# Patient Record
Sex: Male | Born: 1985 | Race: White | Hispanic: No | Smoking: Former smoker
Health system: Southern US, Community
[De-identification: ages and names within clinical notes are randomized; demographics above are authoritative.]

## PROBLEM LIST (undated history)

## (undated) DIAGNOSIS — F419 Anxiety disorder, unspecified: Secondary | ICD-10-CM

## (undated) DIAGNOSIS — F909 Attention-deficit hyperactivity disorder, unspecified type: Secondary | ICD-10-CM

## (undated) DIAGNOSIS — F329 Major depressive disorder, single episode, unspecified: Secondary | ICD-10-CM

## (undated) DIAGNOSIS — F32A Depression, unspecified: Secondary | ICD-10-CM

---

## 2014-02-03 ENCOUNTER — Emergency Department (HOSPITAL_COMMUNITY): Payer: Self-pay

## 2014-02-03 ENCOUNTER — Emergency Department (HOSPITAL_COMMUNITY)
Admission: EM | Admit: 2014-02-03 | Discharge: 2014-02-03 | Disposition: A | Discharge status: Home / Self Care | Payer: Self-pay | Attending: Emergency Medicine | Admitting: Emergency Medicine

## 2014-02-03 ENCOUNTER — Encounter (HOSPITAL_COMMUNITY): Payer: Self-pay | Admitting: Emergency Medicine

## 2014-02-03 DIAGNOSIS — Z720 Tobacco use: Secondary | ICD-10-CM | POA: Insufficient documentation

## 2014-02-03 DIAGNOSIS — S32010A Wedge compression fracture of first lumbar vertebra, initial encounter for closed fracture: Secondary | ICD-10-CM | POA: Insufficient documentation

## 2014-02-03 DIAGNOSIS — Y92410 Unspecified street and highway as the place of occurrence of the external cause: Secondary | ICD-10-CM | POA: Insufficient documentation

## 2014-02-03 DIAGNOSIS — Y990 Civilian activity done for income or pay: Secondary | ICD-10-CM | POA: Insufficient documentation

## 2014-02-03 DIAGNOSIS — Y9389 Activity, other specified: Secondary | ICD-10-CM | POA: Insufficient documentation

## 2014-02-03 DIAGNOSIS — S32000A Wedge compression fracture of unspecified lumbar vertebra, initial encounter for closed fracture: Secondary | ICD-10-CM

## 2014-02-03 DIAGNOSIS — R451 Restlessness and agitation: Secondary | ICD-10-CM | POA: Insufficient documentation

## 2014-02-03 LAB — CBC WITH DIFFERENTIAL/PLATELET
BASOS PCT: 1 % (ref 0–1)
Basophils Absolute: 0.1 10*3/uL (ref 0.0–0.1)
Eosinophils Absolute: 0.2 10*3/uL (ref 0.0–0.7)
Eosinophils Relative: 2 % (ref 0–5)
HCT: 50.3 % (ref 39.0–52.0)
HEMOGLOBIN: 17.7 g/dL — AB (ref 13.0–17.0)
LYMPHS PCT: 28 % (ref 12–46)
Lymphs Abs: 2.3 10*3/uL (ref 0.7–4.0)
MCH: 32.7 pg (ref 26.0–34.0)
MCHC: 35.2 g/dL (ref 30.0–36.0)
MCV: 93 fL (ref 78.0–100.0)
MONO ABS: 0.9 10*3/uL (ref 0.1–1.0)
Monocytes Relative: 11 % (ref 3–12)
NEUTROS PCT: 58 % (ref 43–77)
Neutro Abs: 4.8 10*3/uL (ref 1.7–7.7)
PLATELETS: 233 10*3/uL (ref 150–400)
RBC: 5.41 MIL/uL (ref 4.22–5.81)
RDW: 14.2 % (ref 11.5–15.5)
WBC: 8.3 10*3/uL (ref 4.0–10.5)

## 2014-02-03 LAB — URINALYSIS, ROUTINE W REFLEX MICROSCOPIC
Bilirubin Urine: NEGATIVE
GLUCOSE, UA: NEGATIVE mg/dL
Hgb urine dipstick: NEGATIVE
KETONES UR: NEGATIVE mg/dL
LEUKOCYTES UA: NEGATIVE
NITRITE: NEGATIVE
Protein, ur: NEGATIVE mg/dL
Specific Gravity, Urine: 1.006 (ref 1.005–1.030)
Urobilinogen, UA: 0.2 mg/dL (ref 0.0–1.0)
pH: 6.5 (ref 5.0–8.0)

## 2014-02-03 LAB — COMPREHENSIVE METABOLIC PANEL
ALT: 22 U/L (ref 0–53)
ANION GAP: 9 (ref 5–15)
AST: 46 U/L — ABNORMAL HIGH (ref 0–37)
Albumin: 4.2 g/dL (ref 3.5–5.2)
Alkaline Phosphatase: 71 U/L (ref 39–117)
CO2: 28 mmol/L (ref 19–32)
CREATININE: 0.94 mg/dL (ref 0.50–1.35)
Calcium: 9.4 mg/dL (ref 8.4–10.5)
Chloride: 105 mEq/L (ref 96–112)
GFR calc non Af Amer: >90 mL/min (ref >90)
Glucose, Bld: 89 mg/dL (ref 70–99)
Potassium: 3.7 mmol/L (ref 3.5–5.1)
Sodium: 142 mmol/L (ref 135–145)
TOTAL PROTEIN: 6.8 g/dL (ref 6.0–8.3)
Total Bilirubin: 0.6 mg/dL (ref 0.3–1.2)

## 2014-02-03 LAB — RAPID URINE DRUG SCREEN, HOSP PERFORMED
Amphetamines: POSITIVE — AB
Barbiturates: NOT DETECTED
Benzodiazepines: NOT DETECTED
Cocaine: NOT DETECTED
OPIATES: NOT DETECTED
Tetrahydrocannabinol: NOT DETECTED

## 2014-02-03 LAB — ETHANOL: ALCOHOL ETHYL (B): 218 mg/dL — AB (ref 0–9)

## 2014-02-03 MED ORDER — HYDROMORPHONE HCL 1 MG/ML IJ SOLN
1.0000 mg | Freq: Once | INTRAMUSCULAR | Status: AC
  Administered 2014-02-03: 1 mg via INTRAVENOUS
  Filled 2014-02-03: qty 1

## 2014-02-03 MED ORDER — KETOROLAC TROMETHAMINE 30 MG/ML IJ SOLN
30.0000 mg | Freq: Once | INTRAMUSCULAR | Status: AC
  Administered 2014-02-03: 30 mg via INTRAVENOUS
  Filled 2014-02-03: qty 1

## 2014-02-03 MED ORDER — METHOCARBAMOL 500 MG PO TABS
1000.0000 mg | ORAL_TABLET | Freq: Once | ORAL | Status: AC
  Administered 2014-02-03: 1000 mg via ORAL
  Filled 2014-02-03: qty 2

## 2014-02-03 MED ORDER — HYDROMORPHONE HCL 1 MG/ML IJ SOLN
1.0000 mg | Freq: Once | INTRAMUSCULAR | Status: AC
  Administered 2014-02-03: 1 mg via INTRAMUSCULAR
  Filled 2014-02-03: qty 1

## 2014-02-03 MED ORDER — HYDROMORPHONE HCL 1 MG/ML IJ SOLN
0.5000 mg | Freq: Once | INTRAMUSCULAR | Status: AC
  Administered 2014-02-03: 0.5 mg via INTRAVENOUS
  Filled 2014-02-03: qty 1

## 2014-02-03 MED ORDER — OXYCODONE-ACETAMINOPHEN 5-325 MG PO TABS: 2.0000 | ORAL_TABLET | ORAL | Status: DC | PRN

## 2014-02-03 MED ORDER — METHOCARBAMOL 750 MG PO TABS: 750.0000 mg | ORAL_TABLET | Freq: Three times a day (TID) | ORAL | Status: DC | PRN

## 2014-02-03 MED ORDER — IOHEXOL 300 MG/ML  SOLN
100.0000 mL | Freq: Once | INTRAMUSCULAR | Status: AC | PRN
  Administered 2014-02-03: 100 mL via INTRAVENOUS

## 2014-02-03 NOTE — ED Notes (Signed)
IV was not documented but this RN removed a 20G from the L AC.  Clean, dry and intact with cathetar present on removal.

## 2014-02-03 NOTE — ED Notes (Signed)
Patient transported to X-ray and to CT.

## 2014-02-03 NOTE — Discharge Instructions (Signed)
Wear brace 24 hours a day, 7 days a week except for shower and sleeping.  Follow up with Dr Jeral FruitBotero in 2-3 weeks.  Return to the ER for worsening pain, numbness, weakness, or other new concerning symptoms.  Expect to be sore for 2-4 weeks and develop new areas of soreness over the next 24-48 hours.   Back, Compression Fracture A compression fracture happens when a force is put upon the length of your spine. Slipping and falling on your bottom are examples of such a force. When this happens, sometimes the force is great enough to compress the building blocks (vertebral bodies) of your spine. Although this causes a lot of pain, this can usually be treated at home, unless your caregiver feels hospitalization is needed for pain control. Your backbone (spinal column) is made up of 24 main vertebral bodies in addition to the sacrum and coccyx (see illustration). These are held together by tough fibrous tissues (ligaments) and by support of your muscles. Nerve roots pass through the openings between the vertebrae. A sudden wrenching move, injury, or a fall may cause a compression fracture of one of the vertebral bodies. This may result in back pain or spread of pain into the belly (abdomen), the buttocks, and down the leg into the foot. Pain may also be created by muscle spasm alone. Large studies have been undertaken to determine the best possible course of action to help your back following injury and also to prevent future problems. The recommendations are as follows. FOLLOWING A COMPRESSION FRACTURE: Do the following only if advised by your caregiver.   If a back brace has been suggested or provided, wear it as directed.  Do not stop wearing the back brace unless instructed by your caregiver.  When allowed to return to regular activities, avoid a sedentary lifestyle. Actively exercise. Sporadic weekend binges of tennis, racquetball, or waterskiing may actually aggravate or create problems, especially if  you are not in condition for that activity.  Avoid sports requiring sudden body movements until you are in condition for them. Swimming and walking are safer activities.  Maintain good posture.  Avoid obesity.  If not already done, you should have a DEXA scan. Based on the results, be treated for osteoporosis. FOLLOWING ACUTE (SUDDEN) INJURY:  Only take over-the-counter or prescription medicines for pain, discomfort, or fever as directed by your caregiver.  Use bed rest for only the most extreme acute episode. Prolonged bed rest may aggravate your condition. Ice used for acute conditions is effective. Use a large plastic bag filled with ice. Wrap it in a towel. This also provides excellent pain relief. This may be continuous. Or use it for 30 minutes every 2 hours during acute phase, then as needed. Heat for 30 minutes prior to activities is helpful.  As soon as the acute phase (the time when your back is too painful for you to do normal activities) is over, it is important to resume normal activities and work Arboriculturisthardening programs. Back injuries can cause potentially marked changes in lifestyle. So it is important to attack these problems aggressively.  See your caregiver for continued problems. He or she can help or refer you for appropriate exercises, physical therapy, and work hardening if needed.  If you are given narcotic medications for your condition, for the next 24 hours do not:  Drive.  Operate machinery or power tools.  Sign legal documents.  Do not drink alcohol, or take sleeping pills or other medications that may interfere with  treatment. If your caregiver has given you a follow-up appointment, it is very important to keep that appointment. Not keeping the appointment could result in a chronic or permanent injury, pain, and disability. If there is any problem keeping the appointment, you must call back to this facility for assistance.  SEEK IMMEDIATE MEDICAL CARE IF:  You  develop numbness, tingling, weakness, or problems with the use of your arms or legs.  You develop severe back pain not relieved with medications.  You have changes in bowel or bladder control.  You have increasing pain in any areas of the body. Document Released: 01/28/2005 Document Revised: 06/14/2013 Document Reviewed: 09/02/2007 [facility] Patient Information 2015 [place], [place]. This information is not intended to replace advice given to you by your health care provider. Make sure you discuss any questions you have with your health care provider.  Motor Vehicle Collision It is common to have multiple bruises and sore muscles after a motor vehicle collision (MVC). These tend to feel worse for the first 24 hours. You may have the most stiffness and soreness over the first several hours. You may also feel worse when you wake up the first morning after your collision. After this point, you will usually begin to improve with each day. The speed of improvement often depends on the severity of the collision, the number of injuries, and the location and nature of these injuries. HOME CARE INSTRUCTIONS  Put ice on the injured area.  Put ice in a plastic bag.  Place a towel between your skin and the bag.  Leave the ice on for 15-20 minutes, 3-4 times a day, or as directed by your health care provider.  Drink enough fluids to keep your urine clear or pale yellow. Do not drink alcohol.  Take a warm shower or bath once or twice a day. This will increase blood flow to sore muscles.  You may return to activities as directed by your caregiver. Be careful when lifting, as this may aggravate neck or back pain.  Only take over-the-counter or prescription medicines for pain, discomfort, or fever as directed by your caregiver. Do not use aspirin. This may increase bruising and bleeding. SEEK IMMEDIATE MEDICAL CARE IF:  You have numbness, tingling, or weakness in the arms or legs.  You develop severe  headaches not relieved with medicine.  You have severe neck pain, especially tenderness in the middle of the back of your neck.  You have changes in bowel or bladder control.  There is increasing pain in any area of the body.  You have shortness of breath, light-headedness, dizziness, or fainting.  You have chest pain.  You feel sick to your stomach (nauseous), throw up (vomit), or sweat.  You have increasing abdominal discomfort.  There is blood in your urine, stool, or vomit.  You have pain in your shoulder (shoulder strap areas).  You feel your symptoms are getting worse. MAKE SURE YOU:  Understand these instructions.  Will watch your condition.  Will get help right away if you are not doing well or get worse. Document Released: 01/28/2005 Document Revised: 06/14/2013 Document Reviewed: 06/27/2010 [facility] Patient Information 2015 [place], [place]. This information is not intended to replace advice given to you by your health care provider. Make sure you discuss any questions you have with your health care provider.  Thoracolumbosacral Orthosis Brace A thoracolumbosacral orthosis (TLSO) brace can be worn for different purposes. A TLSO brace can be worn to support the back. Your back may need  more support if you had a broken bone in your back (fractured vertebrae), back surgery, or you cannot move (paralysis) your torso muscles. A TLSO brace can also be worn by a child to help prevent curving back problems from getting worse as the child grows. TLSO braces are used to limit bending and twisting. The braces are made from a hard plastic. They are custom fit for each wearer. The TLSO brace covers both the front and back of the torso. On the back, the brace reaches from just above the tailbone to just below the shoulder blades. On the front, the brace covers the ribs and often reaches past the waist and over the hips. The brace can be worn under clothing.  HOME CARE INSTRUCTIONS  Ask  your caregiver if you can remove your brace when showering or sleeping.  Follow your caregiver's instructions for putting on your brace.  Follow your caregiver's instructions about how much weight you can lift.  Always wear a T-shirt under the brace.  Make sure the brace is always well-aligned and fits you closely.  Check your skin daily for sores and redness caused by rubbing or pressure.  If you feel unsteady, use a cane or walker for support until you feel more steady.  Sit in high, firm chairs. It may be difficult to stand up from low, soft chairs.  Keep all follow-up appointments as directed by your caregiver. SEEK IMMEDIATE MEDICAL CARE IF:  You have a fever.  You have shortness of breath.  You have chest pain.  You have numbness, tingling, or pain. Developed in conjunction with the [employer] at [facility]. Document Released: 01/17/2011 Document Revised: 04/22/2011 Document Reviewed: 01/17/2011 [facility] Patient Information 2015 [place], [place]. This information is not intended to replace advice given to you by your health care provider. Make sure you discuss any questions you have with your health care provider.

## 2014-02-03 NOTE — ED Notes (Signed)
[facility] has arrived with pt brace
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
## 2014-02-03 NOTE — ED Notes (Signed)
Reviewed discharge instructions with pt and answered his mothers questions

## 2014-02-03 NOTE — ED Notes (Signed)
Patient transported to X-ray 

## 2014-02-03 NOTE — ED Notes (Addendum)
Timothy Moses [phone], call when patient is ready for discharge.

## 2014-02-03 NOTE — ED Notes (Signed)
Called ortho tech to check on progress of getting pt a brace for his back. States that the order was placed at 9 am. States is should arrive soon

## 2014-02-03 NOTE — ED Notes (Signed)
Ortho called and notified to send TLSO brace.  Patient updated on the status and that he will be waiting for several more hours until brace is available per Ortho.

## 2014-05-17 ENCOUNTER — Encounter (HOSPITAL_COMMUNITY): Payer: Self-pay | Admitting: *Deleted

## 2014-05-17 ENCOUNTER — Inpatient Hospital Stay (HOSPITAL_COMMUNITY)
Admission: AD | Admit: 2014-05-17 | Discharge: 2014-05-23 | DRG: 885 | Disposition: A | Discharge status: Home / Self Care | Payer: Federal, State, Local not specified - Other | Source: Intra-hospital | Attending: Psychiatry | Admitting: Psychiatry

## 2014-05-17 ENCOUNTER — Encounter (HOSPITAL_COMMUNITY): Payer: Self-pay | Admitting: Emergency Medicine

## 2014-05-17 ENCOUNTER — Emergency Department (HOSPITAL_COMMUNITY)
Admission: EM | Admit: 2014-05-17 | Discharge: 2014-05-17 | Disposition: A | Discharge status: Other Acute Inpatient Hospital | Payer: Self-pay | Attending: Emergency Medicine | Admitting: Emergency Medicine

## 2014-05-17 ENCOUNTER — Emergency Department (HOSPITAL_COMMUNITY): Payer: Self-pay

## 2014-05-17 DIAGNOSIS — R45851 Suicidal ideations: Secondary | ICD-10-CM | POA: Diagnosis present

## 2014-05-17 DIAGNOSIS — F1994 Other psychoactive substance use, unspecified with psychoactive substance-induced mood disorder: Secondary | ICD-10-CM | POA: Diagnosis present

## 2014-05-17 DIAGNOSIS — F332 Major depressive disorder, recurrent severe without psychotic features: Secondary | ICD-10-CM | POA: Diagnosis present

## 2014-05-17 DIAGNOSIS — F1020 Alcohol dependence, uncomplicated: Secondary | ICD-10-CM | POA: Diagnosis not present

## 2014-05-17 DIAGNOSIS — Y998 Other external cause status: Secondary | ICD-10-CM | POA: Insufficient documentation

## 2014-05-17 DIAGNOSIS — F10230 Alcohol dependence with withdrawal, uncomplicated: Secondary | ICD-10-CM | POA: Insufficient documentation

## 2014-05-17 DIAGNOSIS — F419 Anxiety disorder, unspecified: Secondary | ICD-10-CM | POA: Diagnosis not present

## 2014-05-17 DIAGNOSIS — Z811 Family history of alcohol abuse and dependence: Secondary | ICD-10-CM | POA: Diagnosis not present

## 2014-05-17 DIAGNOSIS — R4182 Altered mental status, unspecified: Secondary | ICD-10-CM

## 2014-05-17 DIAGNOSIS — T189XXA Foreign body of alimentary tract, part unspecified, initial encounter: Secondary | ICD-10-CM

## 2014-05-17 DIAGNOSIS — F1920 Other psychoactive substance dependence, uncomplicated: Secondary | ICD-10-CM | POA: Diagnosis present

## 2014-05-17 DIAGNOSIS — Y929 Unspecified place or not applicable: Secondary | ICD-10-CM | POA: Diagnosis not present

## 2014-05-17 DIAGNOSIS — F909 Attention-deficit hyperactivity disorder, unspecified type: Secondary | ICD-10-CM | POA: Diagnosis present

## 2014-05-17 DIAGNOSIS — T43212A Poisoning by selective serotonin and norepinephrine reuptake inhibitors, intentional self-harm, initial encounter: Secondary | ICD-10-CM | POA: Insufficient documentation

## 2014-05-17 DIAGNOSIS — T50901A Poisoning by unspecified drugs, medicaments and biological substances, accidental (unintentional), initial encounter: Secondary | ICD-10-CM | POA: Diagnosis present

## 2014-05-17 DIAGNOSIS — G4700 Insomnia, unspecified: Secondary | ICD-10-CM | POA: Diagnosis present

## 2014-05-17 DIAGNOSIS — F4010 Social phobia, unspecified: Secondary | ICD-10-CM | POA: Diagnosis not present

## 2014-05-17 DIAGNOSIS — Y9389 Activity, other specified: Secondary | ICD-10-CM | POA: Insufficient documentation

## 2014-05-17 DIAGNOSIS — Y9289 Other specified places as the place of occurrence of the external cause: Secondary | ICD-10-CM | POA: Insufficient documentation

## 2014-05-17 DIAGNOSIS — F17210 Nicotine dependence, cigarettes, uncomplicated: Secondary | ICD-10-CM | POA: Diagnosis present

## 2014-05-17 DIAGNOSIS — IMO0001 Reserved for inherently not codable concepts without codable children: Secondary | ICD-10-CM

## 2014-05-17 DIAGNOSIS — Z720 Tobacco use: Secondary | ICD-10-CM | POA: Insufficient documentation

## 2014-05-17 DIAGNOSIS — F902 Attention-deficit hyperactivity disorder, combined type: Secondary | ICD-10-CM | POA: Insufficient documentation

## 2014-05-17 DIAGNOSIS — Z79899 Other long term (current) drug therapy: Secondary | ICD-10-CM | POA: Insufficient documentation

## 2014-05-17 DIAGNOSIS — IMO0002 Reserved for concepts with insufficient information to code with codable children: Secondary | ICD-10-CM | POA: Insufficient documentation

## 2014-05-17 DIAGNOSIS — T1491 Suicide attempt: Secondary | ICD-10-CM

## 2014-05-17 DIAGNOSIS — F1990 Other psychoactive substance use, unspecified, uncomplicated: Secondary | ICD-10-CM

## 2014-05-17 HISTORY — DX: Major depressive disorder, single episode, unspecified: F32.9

## 2014-05-17 HISTORY — DX: Attention-deficit hyperactivity disorder, unspecified type: F90.9

## 2014-05-17 HISTORY — DX: Anxiety disorder, unspecified: F41.9

## 2014-05-17 HISTORY — DX: Depression, unspecified: F32.A

## 2014-05-17 LAB — RAPID URINE DRUG SCREEN, HOSP PERFORMED
AMPHETAMINES: POSITIVE — AB
Barbiturates: NOT DETECTED
Benzodiazepines: POSITIVE — AB
Cocaine: POSITIVE — AB
OPIATES: NOT DETECTED
Tetrahydrocannabinol: POSITIVE — AB

## 2014-05-17 LAB — COMPREHENSIVE METABOLIC PANEL
ALK PHOS: 62 U/L (ref 39–117)
ALT: 18 U/L (ref 0–53)
AST: 30 U/L (ref 0–37)
Albumin: 4.3 g/dL (ref 3.5–5.2)
Anion gap: 12 (ref 5–15)
BUN: 11 mg/dL (ref 6–23)
CALCIUM: 8.8 mg/dL (ref 8.4–10.5)
CO2: 24 mmol/L (ref 19–32)
Chloride: 106 mmol/L (ref 96–112)
Creatinine, Ser: 0.8 mg/dL (ref 0.50–1.35)
GFR calc Af Amer: >90 mL/min (ref >90)
GFR calc non Af Amer: >90 mL/min (ref >90)
Glucose, Bld: 110 mg/dL — ABNORMAL HIGH (ref 70–99)
POTASSIUM: 3.4 mmol/L — AB (ref 3.5–5.1)
SODIUM: 142 mmol/L (ref 135–145)
TOTAL PROTEIN: 7.1 g/dL (ref 6.0–8.3)
Total Bilirubin: 0.5 mg/dL (ref 0.3–1.2)

## 2014-05-17 LAB — CBC WITH DIFFERENTIAL/PLATELET
BASOS ABS: 0 10*3/uL (ref 0.0–0.1)
Basophils Relative: 1 % (ref 0–1)
EOS PCT: 3 % (ref 0–5)
Eosinophils Absolute: 0.2 10*3/uL (ref 0.0–0.7)
HCT: 42.7 % (ref 39.0–52.0)
Hemoglobin: 14.8 g/dL (ref 13.0–17.0)
LYMPHS ABS: 2.3 10*3/uL (ref 0.7–4.0)
LYMPHS PCT: 36 % (ref 12–46)
MCH: 32.2 pg (ref 26.0–34.0)
MCHC: 34.7 g/dL (ref 30.0–36.0)
MCV: 92.8 fL (ref 78.0–100.0)
Monocytes Absolute: 0.6 10*3/uL (ref 0.1–1.0)
Monocytes Relative: 10 % (ref 3–12)
NEUTROS PCT: 50 % (ref 43–77)
Neutro Abs: 3.1 10*3/uL (ref 1.7–7.7)
PLATELETS: 255 10*3/uL (ref 150–400)
RBC: 4.6 MIL/uL (ref 4.22–5.81)
RDW: 12.9 % (ref 11.5–15.5)
WBC: 6.3 10*3/uL (ref 4.0–10.5)

## 2014-05-17 LAB — BLOOD GAS, ARTERIAL
Acid-base deficit: 3 mmol/L — ABNORMAL HIGH (ref 0.0–2.0)
BICARBONATE: 22.4 meq/L (ref 20.0–24.0)
Drawn by: 31814
FIO2: 0.21 %
O2 SAT: 93.5 %
Patient temperature: 97.6
TCO2: 20 mmol/L (ref 0–100)
pCO2 arterial: 42.5 mmHg (ref 35.0–45.0)
pH, Arterial: 7.339 — ABNORMAL LOW (ref 7.350–7.450)
pO2, Arterial: 76.1 mmHg — ABNORMAL LOW (ref 80.0–100.0)

## 2014-05-17 LAB — URINALYSIS, ROUTINE W REFLEX MICROSCOPIC
Bilirubin Urine: NEGATIVE
GLUCOSE, UA: NEGATIVE mg/dL
Hgb urine dipstick: NEGATIVE
Ketones, ur: NEGATIVE mg/dL
LEUKOCYTES UA: NEGATIVE
NITRITE: NEGATIVE
PROTEIN: NEGATIVE mg/dL
SPECIFIC GRAVITY, URINE: 1.005 (ref 1.005–1.030)
Urobilinogen, UA: 0.2 mg/dL (ref 0.0–1.0)
pH: 6 (ref 5.0–8.0)

## 2014-05-17 LAB — ACETAMINOPHEN LEVEL

## 2014-05-17 LAB — ETHANOL: Alcohol, Ethyl (B): 222 mg/dL — ABNORMAL HIGH (ref 0–9)

## 2014-05-17 LAB — CBG MONITORING, ED: Glucose-Capillary: 86 mg/dL (ref 70–99)

## 2014-05-17 LAB — SALICYLATE LEVEL: Salicylate Lvl: <4 mg/dL (ref 2.8–20.0)

## 2014-05-17 MED ORDER — ACETAMINOPHEN 325 MG PO TABS
650.0000 mg | ORAL_TABLET | Freq: Four times a day (QID) | ORAL | Status: DC | PRN
Start: 2014-05-17 — End: 2014-05-23

## 2014-05-17 MED ORDER — ADULT MULTIVITAMIN W/MINERALS CH
1.0000 | ORAL_TABLET | Freq: Every day | ORAL | Status: DC
  Administered 2014-05-18 – 2014-05-23 (×6): 1 via ORAL
  Filled 2014-05-17 (×8): qty 1

## 2014-05-17 MED ORDER — ALUM & MAG HYDROXIDE-SIMETH 200-200-20 MG/5ML PO SUSP: 30.0000 mL | ORAL | Status: DC | PRN

## 2014-05-17 MED ORDER — ONDANSETRON 4 MG PO TBDP: 4.0000 mg | ORAL_TABLET | Freq: Four times a day (QID) | ORAL | Status: AC | PRN

## 2014-05-17 MED ORDER — THIAMINE HCL 100 MG/ML IJ SOLN: 100.0000 mg | Freq: Once | INTRAMUSCULAR | Status: DC

## 2014-05-17 MED ORDER — VITAMIN B-1 100 MG PO TABS
100.0000 mg | ORAL_TABLET | Freq: Every day | ORAL | Status: DC
  Administered 2014-05-18 – 2014-05-23 (×6): 100 mg via ORAL
  Filled 2014-05-17 (×8): qty 1

## 2014-05-17 MED ORDER — LORAZEPAM 1 MG PO TABS
1.0000 mg | ORAL_TABLET | Freq: Every day | ORAL | Status: AC
  Administered 2014-05-22: 1 mg via ORAL
  Filled 2014-05-17: qty 1

## 2014-05-17 MED ORDER — LORAZEPAM 1 MG PO TABS
1.0000 mg | ORAL_TABLET | Freq: Four times a day (QID) | ORAL | Status: AC
  Administered 2014-05-17 – 2014-05-19 (×6): 1 mg via ORAL
  Filled 2014-05-17 (×6): qty 1

## 2014-05-17 MED ORDER — LORAZEPAM 1 MG PO TABS
1.0000 mg | ORAL_TABLET | Freq: Three times a day (TID) | ORAL | Status: AC
  Administered 2014-05-19 – 2014-05-20 (×3): 1 mg via ORAL
  Filled 2014-05-17 (×3): qty 1

## 2014-05-17 MED ORDER — MAGNESIUM HYDROXIDE 400 MG/5ML PO SUSP: 30.0000 mL | Freq: Every day | ORAL | Status: DC | PRN

## 2014-05-17 MED ORDER — POTASSIUM CHLORIDE CRYS ER 20 MEQ PO TBCR
20.0000 meq | EXTENDED_RELEASE_TABLET | Freq: Two times a day (BID) | ORAL | Status: AC
  Administered 2014-05-17 – 2014-05-19 (×4): 20 meq via ORAL
  Filled 2014-05-17 (×6): qty 1

## 2014-05-17 MED ORDER — LORAZEPAM 1 MG PO TABS
1.0000 mg | ORAL_TABLET | Freq: Two times a day (BID) | ORAL | Status: AC
  Administered 2014-05-20 – 2014-05-21 (×2): 1 mg via ORAL
  Filled 2014-05-17 (×2): qty 1

## 2014-05-17 MED ORDER — SODIUM CHLORIDE 0.9 % IV SOLN
Freq: Once | INTRAVENOUS | Status: AC
  Administered 2014-05-17: 03:00:00 via INTRAVENOUS

## 2014-05-17 MED ORDER — DOXEPIN HCL 50 MG PO CAPS
50.0000 mg | ORAL_CAPSULE | Freq: Every evening | ORAL | Status: DC | PRN
  Administered 2014-05-17 – 2014-05-22 (×11): 50 mg via ORAL
  Filled 2014-05-17: qty 1
  Filled 2014-05-17: qty 2
  Filled 2014-05-17 (×15): qty 1

## 2014-05-17 MED ORDER — LORAZEPAM 1 MG PO TABS
1.0000 mg | ORAL_TABLET | Freq: Four times a day (QID) | ORAL | Status: AC | PRN
  Administered 2014-05-20: 1 mg via ORAL
  Filled 2014-05-17: qty 1

## 2014-05-17 MED ORDER — HYDROXYZINE HCL 25 MG PO TABS
25.0000 mg | ORAL_TABLET | Freq: Four times a day (QID) | ORAL | Status: DC | PRN
  Administered 2014-05-18 – 2014-05-19 (×2): 25 mg via ORAL
  Filled 2014-05-17 (×2): qty 1

## 2014-05-17 MED ORDER — NALOXONE HCL 1 MG/ML IJ SOLN
1.0000 mg | Freq: Once | INTRAMUSCULAR | Status: AC
  Administered 2014-05-17: 1 mg via INTRAVENOUS
  Filled 2014-05-17: qty 2

## 2014-05-17 MED ORDER — LOPERAMIDE HCL 2 MG PO CAPS: 2.0000 mg | ORAL_CAPSULE | ORAL | Status: AC | PRN

## 2014-05-17 NOTE — ED Notes (Signed)
Patient continues to be lethargic but oriented. Denies SI.  Food/fluids given. Acuity low.

## 2014-05-17 NOTE — ED Notes (Signed)
Acuity low. 

## 2014-05-17 NOTE — ED Notes (Signed)
Pt resting quietly with eyes closed. Appears in no distress. Respirations are even, regular, and unlabored.  

## 2014-05-17 NOTE — ED Provider Notes (Signed)
CSN: [id number]     Arrival date & time 05/17/14  0219 History   First MD Initiated Contact with Patient 05/17/14 0224     Chief Complaint  Patient presents with  . Ingestion   Level V caveat for altered mental status.  (Consider location/radiation/quality/duration/timing/severity/associated sxs/prior Treatment) HPI  Patient reports about 10 PM tonight he took 8 pills. He states he took trazodone. When I ask him why he states "I was bored".  PCP unknown  Past Medical History  Diagnosis Date  . ADHD (attention deficit hyperactivity disorder)   . Depression   . Anxiety    History reviewed. No pertinent past surgical history. No family history on file. History  Substance Use Topics  . Smoking status: Current Every Day Smoker  . Smokeless tobacco: Not on file  . Alcohol Use: Yes     Comment: weekends    Review of Systems  Unable to perform ROS: Mental status change      Allergies  Review of patient's allergies indicates no known allergies.  Home Medications   Prior to Admission medications   Medication Sig Start Date End Date Taking? Authorizing Provider  amphetamine-dextroamphetamine (ADDERALL) 20 MG tablet Take 20 mg by mouth daily.   Yes Historical Provider, MD  traZODone (DESYREL) 150 MG tablet Take 150 mg by mouth at bedtime as needed for sleep.   Yes Historical Provider, MD     ED Triage Vitals  Enc Vitals Group     BP 05/17/14 0223 103/57 mmHg     Pulse Rate 05/17/14 0223 107     Resp 05/17/14 0223 16     Temp 05/17/14 0223 97.6 F (36.4 C)     Temp Source 05/17/14 0223 Oral     SpO2 05/17/14 0223 97 %     Weight --      Height --      Head Cir --      Peak Flow --      Pain Score --      Pain Loc --      Pain Edu? --      Excl. in GC? --    Vital signs normal except for tachycardia   Physical Exam  Constitutional: He is oriented to person, place, and time. He appears well-developed and well-nourished.  Non-toxic appearance. He does not appear  ill. No distress.  Sleeping, easily awakened however he falls back asleep rapidly.  HENT:  Head: Normocephalic and atraumatic.  Right Ear: External ear normal.  Left Ear: External ear normal.  Nose: Nose normal. No mucosal edema or rhinorrhea.  Mouth/Throat: Oropharynx is clear and moist and mucous membranes are normal. No dental abscesses or uvula swelling.  Eyes: Conjunctivae and EOM are normal. Pupils are equal, round, and reactive to light.  Neck: Normal range of motion and full passive range of motion without pain. Neck supple.  Cardiovascular: Normal rate, regular rhythm and normal heart sounds.  Exam reveals no gallop and no friction rub.   No murmur heard. Pulmonary/Chest: Effort normal and breath sounds normal. No respiratory distress. He has no wheezes. He has no rhonchi. He has no rales. He exhibits no tenderness and no crepitus.  Abdominal: Soft. Normal appearance and bowel sounds are normal. He exhibits no distension. There is no tenderness. There is no rebound and no guarding.  Musculoskeletal: Normal range of motion. He exhibits no edema or tenderness.  Moves all extremities well.   Neurological: He is alert and oriented to person, place, and  time. He has normal strength. No cranial nerve deficit.  Skin: Skin is warm, dry and intact. No rash noted. No erythema. No pallor.  Psychiatric: His speech is delayed and slurred. He is slowed.  Flat affect  Nursing note and vitals reviewed.   ED Course  Procedures (including critical care time)  Medications  naloxone (NARCAN) injection 1 mg (1 mg Intravenous Given 05/17/14 0238)  0.9 %  sodium chloride infusion ( Intravenous Stopped 05/17/14 0412)   Patient was discussed with poison control by nursing staff. They state to watch for somnolence and QT changes. They state to observe this 6 hours and if he still somnolent he may taken something else.  2:30 am patient was awake and alert. He was interacting with the patient in the next  stretcher. He called his stepmother.  5 AM patient stepmother is at his bedside. She states he called her and told her that he had tried to overdose tonight intentionally to hurt himself. TSS consult was ordered.   TSS was unable to evaluate patient b/o sleepiness.  They will try again later.   Labs Review Results for orders placed or performed during the hospital encounter of 05/17/14  Comprehensive metabolic panel  Result Value Ref Range   Sodium 142 135 - 145 mmol/L   Potassium 3.4 (L) 3.5 - 5.1 mmol/L   Chloride 106 96 - 112 mmol/L   CO2 24 19 - 32 mmol/L   Glucose, Bld 110 (H) 70 - 99 mg/dL   BUN 11 6 - 23 mg/dL   Creatinine, Ser 1.61 0.50 - 1.35 mg/dL   Calcium 8.8 8.4 - 09.6 mg/dL   Total Protein 7.1 6.0 - 8.3 g/dL   Albumin 4.3 3.5 - 5.2 g/dL   AST 30 0 - 37 U/L   ALT 18 0 - 53 U/L   Alkaline Phosphatase 62 39 - 117 U/L   Total Bilirubin 0.5 0.3 - 1.2 mg/dL   GFR calc non Af Amer >90 >90 mL/min   GFR calc Af Amer >90 >90 mL/min   Anion gap 12 5 - 15  CBC with Differential  Result Value Ref Range   WBC 6.3 4.0 - 10.5 K/uL   RBC 4.60 4.22 - 5.81 MIL/uL   Hemoglobin 14.8 13.0 - 17.0 g/dL   HCT 04.5 40.9 - 81.1 %   MCV 92.8 78.0 - 100.0 fL   MCH 32.2 26.0 - 34.0 pg   MCHC 34.7 30.0 - 36.0 g/dL   RDW 91.4 78.2 - 95.6 %   Platelets 255 150 - 400 K/uL   Neutrophils Relative % 50 43 - 77 %   Neutro Abs 3.1 1.7 - 7.7 K/uL   Lymphocytes Relative 36 12 - 46 %   Lymphs Abs 2.3 0.7 - 4.0 K/uL   Monocytes Relative 10 3 - 12 %   Monocytes Absolute 0.6 0.1 - 1.0 K/uL   Eosinophils Relative 3 0 - 5 %   Eosinophils Absolute 0.2 0.0 - 0.7 K/uL   Basophils Relative 1 0 - 1 %   Basophils Absolute 0.0 0.0 - 0.1 K/uL  Urine rapid drug screen (hosp performed)  Result Value Ref Range   Opiates NONE DETECTED NONE DETECTED   Cocaine POSITIVE (A) NONE DETECTED   Benzodiazepines POSITIVE (A) NONE DETECTED   Amphetamines POSITIVE (A) NONE DETECTED   Tetrahydrocannabinol POSITIVE (A)  NONE DETECTED   Barbiturates NONE DETECTED NONE DETECTED  Urinalysis, Routine w reflex microscopic  Result Value Ref Range   Color,  Urine YELLOW YELLOW   APPearance CLEAR CLEAR   Specific Gravity, Urine 1.005 1.005 - 1.030   pH 6.0 5.0 - 8.0   Glucose, UA NEGATIVE NEGATIVE mg/dL   Hgb urine dipstick NEGATIVE NEGATIVE   Bilirubin Urine NEGATIVE NEGATIVE   Ketones, ur NEGATIVE NEGATIVE mg/dL   Protein, ur NEGATIVE NEGATIVE mg/dL   Urobilinogen, UA 0.2 0.0 - 1.0 mg/dL   Nitrite NEGATIVE NEGATIVE   Leukocytes, UA NEGATIVE NEGATIVE  Ethanol  Result Value Ref Range   Alcohol, Ethyl (B) 222 (H) 0 - 9 mg/dL  Salicylate level  Result Value Ref Range   Salicylate Lvl <4.0 2.8 - 20.0 mg/dL  Acetaminophen level  Result Value Ref Range   Acetaminophen (Tylenol), Serum <10.0 (L) 10 - 30 ug/mL  Blood gas, arterial ([facility] & [facility] ONLY)  Result Value Ref Range   FIO2 0.21 %   pH, Arterial 7.339 (L) 7.350 - 7.450   pCO2 arterial 42.5 35.0 - 45.0 mmHg   pO2, Arterial 76.1 (L) 80.0 - 100.0 mmHg   Bicarbonate 22.4 20.0 - 24.0 mEq/L   TCO2 20.0 0 - 100 mmol/L   Acid-base deficit 3.0 (H) 0.0 - 2.0 mmol/L   O2 Saturation 93.5 %   Patient temperature 97.6    Collection site RIGHT RADIAL    Drawn by [phone]    Sample type ARTERIAL    Allens test (pass/fail) PASS PASS  POC CBG, ED  Result Value Ref Range   Glucose-Capillary 86 70 - 99 mg/dL   Comment 1 Notify RN    Laboratory interpretation all normal except positive UDS, alcohol intoxication, mild hypokalemia        Imaging Review Dg Chest Port 1 View  05/17/2014   CLINICAL DATA:  Overdose.  Ingestion of foreign substance.  EXAM: PORTABLE CHEST - 1 VIEW  COMPARISON:  None.  FINDINGS: The heart size and mediastinal contours are within normal limits. Both lungs are clear. The visualized skeletal structures are unremarkable.  IMPRESSION: No active disease.   Electronically Signed   By: Burman NievesWilliam  Stevens M.D.   On: 05/17/2014 03:12     EKG  Interpretation   Date/Time:  Tuesday May 17 2014 02:22:08 EDT Ventricular Rate:  93 PR Interval:  135 QRS Duration: 100 QT Interval:  355 QTC Calculation: 441 R Axis:   54 Text Interpretation:  Sinus rhythm RSR' in V1 or V2, right VCD or RVH No  old tracing to compare Confirmed by Lenell Mcconnell  MD-I, Griffith Santilli ([id number]) on 05/17/2014  2:25:37 AM      MDM   Final diagnoses:  Altered mental status, unspecified altered mental status type  Drug ingestion, intentional self-harm, initial encounter    Disposition pending   Devoria AlbeIva Elmon Shader, MD, Concha PyoFACEP     Izora Benn, MD 05/17/14 [phone]

## 2014-05-17 NOTE — BH Assessment (Signed)
[facility] Assessment Progress Note   Clinician attempted to see patient at 06:00.  Pt too lethargic and drowsy to participate in TTS assessment at this time.  Clinician talked to nurse Terri about getting the consult removed and placed later when pt is more alert.  She said she would talk to Dr. Devoria AlbeIva Knapp.  Patient had reportedly taken about 6 trazadone around 22:00 on 04/04 because he was sad.  Patient was told that psychiatry would probably see him when he is more alert.  Pt appeared to be sleeping however.

## 2014-05-17 NOTE — Consult Note (Signed)
[facility] Face-to-Face Psychiatry Consult   Reason for Consult:  Overdose Referring Physician:  EDP Patient Identification: Timothy Moses MRN:  [id number] Principal Diagnosis: Depression, major, severe recurrence Diagnosis:   Patient Active Problem List   Diagnosis Date Noted  . Polysubstance dependence [F19.20] 05/17/2014    Priority: High  . Overdose [T50.901A] 05/17/2014    Priority: High  . Depression, major, severe recurrence [F33.2] 05/17/2014    Priority: High    Total Time spent with patient: 45 minutes  Subjective:   Timothy Moses is a 29 y.o. male patient admitted with intentional overdose, suicide attempt.  HPI:  The patient presented to the ED after an overdose on Trazodone after drinking, BAL 222, and using drugs, Adderall-cocaine, Benzodiazepines, THC.  He remains drowsy and denies suicide attempt but his mother is at the bedside, states he has threatened and "faked" suicide in the past.  She claims he faked suicide in the past to upset her.  He was living with his mother last summer, attending AA, and doing well.  He moved out with friends and relapsed.  He was in [place] for addictions but left in January.  Denies homicidal ideations and hallucinations. HPI Elements:   Location:  generalized. Quality:  acute. Severity:  severe. Timing:  constant. Duration:  few days. Context:  stressors, drug abuse.  Past Medical History:  Past Medical History  Diagnosis Date  . ADHD (attention deficit hyperactivity disorder)   . Depression   . Anxiety    History reviewed. No pertinent past surgical history. Family History: No family history on file. Social History:  History  Alcohol Use  . Yes    Comment: weekends     History  Drug Use No    Comment: denies    History   Social History  . Marital Status: Significant Other    Spouse Name: N/A  . Number of Children: N/A  . Years of Education: N/A   Social History Main Topics  . Smoking status: Current Every Day  Smoker  . Smokeless tobacco: Not on file  . Alcohol Use: Yes     Comment: weekends  . Drug Use: No     Comment: denies  . Sexual Activity: Not on file   Other Topics Concern  . None   Social History Narrative  . None   Additional Social History:                          Allergies:  No Known Allergies  Labs:  Results for orders placed or performed during the hospital encounter of 05/17/14 (from the past 48 hour(s))  Comprehensive metabolic panel     Status: Abnormal   Collection Time: 05/17/14  2:26 AM  Result Value Ref Range   Sodium 142 135 - 145 mmol/L   Potassium 3.4 (L) 3.5 - 5.1 mmol/L   Chloride 106 96 - 112 mmol/L   CO2 24 19 - 32 mmol/L   Glucose, Bld 110 (H) 70 - 99 mg/dL   BUN 11 6 - 23 mg/dL   Creatinine, Ser 0.80 0.50 - 1.35 mg/dL   Calcium 8.8 8.4 - 10.5 mg/dL   Total Protein 7.1 6.0 - 8.3 g/dL   Albumin 4.3 3.5 - 5.2 g/dL   AST 30 0 - 37 U/L   ALT 18 0 - 53 U/L   Alkaline Phosphatase 62 39 - 117 U/L   Total Bilirubin 0.5 0.3 - 1.2 mg/dL   GFR  calc non Af Amer >90 >90 mL/min   GFR calc Af Amer >90 >90 mL/min    Comment: (NOTE) The eGFR has been calculated using the CKD EPI equation. This calculation has not been validated in all clinical situations. eGFR's persistently <90 mL/min signify possible Chronic Kidney Disease.    Anion gap 12 5 - 15  CBC with Differential     Status: None   Collection Time: 05/17/14  2:26 AM  Result Value Ref Range   WBC 6.3 4.0 - 10.5 K/uL   RBC 4.60 4.22 - 5.81 MIL/uL   Hemoglobin 14.8 13.0 - 17.0 g/dL   HCT 42.7 39.0 - 52.0 %   MCV 92.8 78.0 - 100.0 fL   MCH 32.2 26.0 - 34.0 pg   MCHC 34.7 30.0 - 36.0 g/dL   RDW 12.9 11.5 - 15.5 %   Platelets 255 150 - 400 K/uL   Neutrophils Relative % 50 43 - 77 %   Neutro Abs 3.1 1.7 - 7.7 K/uL   Lymphocytes Relative 36 12 - 46 %   Lymphs Abs 2.3 0.7 - 4.0 K/uL   Monocytes Relative 10 3 - 12 %   Monocytes Absolute 0.6 0.1 - 1.0 K/uL   Eosinophils Relative 3 0 - 5 %    Eosinophils Absolute 0.2 0.0 - 0.7 K/uL   Basophils Relative 1 0 - 1 %   Basophils Absolute 0.0 0.0 - 0.1 K/uL  Ethanol     Status: Abnormal   Collection Time: 05/17/14  2:26 AM  Result Value Ref Range   Alcohol, Ethyl (B) 222 (H) 0 - 9 mg/dL    Comment:        LOWEST DETECTABLE LIMIT FOR SERUM ALCOHOL IS 11 mg/dL FOR MEDICAL PURPOSES ONLY   Salicylate level     Status: None   Collection Time: 05/17/14  2:26 AM  Result Value Ref Range   Salicylate Lvl <9.1 2.8 - 20.0 mg/dL  Acetaminophen level     Status: Abnormal   Collection Time: 05/17/14  2:26 AM  Result Value Ref Range   Acetaminophen (Tylenol), Serum <10.0 (L) 10 - 30 ug/mL    Comment:        THERAPEUTIC CONCENTRATIONS VARY SIGNIFICANTLY. A RANGE OF 10-30 ug/mL MAY BE AN EFFECTIVE CONCENTRATION FOR MANY PATIENTS. HOWEVER, SOME ARE BEST TREATED AT CONCENTRATIONS OUTSIDE THIS RANGE. ACETAMINOPHEN CONCENTRATIONS >150 ug/mL AT 4 HOURS AFTER INGESTION AND >50 ug/mL AT 12 HOURS AFTER INGESTION ARE OFTEN ASSOCIATED WITH TOXIC REACTIONS.   POC CBG, ED     Status: None   Collection Time: 05/17/14  2:42 AM  Result Value Ref Range   Glucose-Capillary 86 70 - 99 mg/dL   Comment 1 Notify RN   Blood gas, arterial ([facility] & [facility] ONLY)     Status: Abnormal   Collection Time: 05/17/14  2:48 AM  Result Value Ref Range   FIO2 0.21 %   pH, Arterial 7.339 (L) 7.350 - 7.450   pCO2 arterial 42.5 35.0 - 45.0 mmHg   pO2, Arterial 76.1 (L) 80.0 - 100.0 mmHg   Bicarbonate 22.4 20.0 - 24.0 mEq/L   TCO2 20.0 0 - 100 mmol/L   Acid-base deficit 3.0 (H) 0.0 - 2.0 mmol/L   O2 Saturation 93.5 %   Patient temperature 97.6    Collection site RIGHT RADIAL    Drawn by [phone]    Sample type ARTERIAL    Allens test (pass/fail) PASS PASS  Urine rapid drug screen (hosp performed)  Status: Abnormal   Collection Time: 05/17/14  3:44 AM  Result Value Ref Range   Opiates NONE DETECTED NONE DETECTED   Cocaine POSITIVE (A) NONE DETECTED    Benzodiazepines POSITIVE (A) NONE DETECTED   Amphetamines POSITIVE (A) NONE DETECTED   Tetrahydrocannabinol POSITIVE (A) NONE DETECTED   Barbiturates NONE DETECTED NONE DETECTED    Comment:        DRUG SCREEN FOR MEDICAL PURPOSES ONLY.  IF CONFIRMATION IS NEEDED FOR ANY PURPOSE, NOTIFY LAB WITHIN 5 DAYS.        LOWEST DETECTABLE LIMITS FOR URINE DRUG SCREEN Drug Class       Cutoff (ng/mL) Amphetamine      1000 Barbiturate      200 Benzodiazepine   195 Tricyclics       093 Opiates          300 Cocaine          300 THC              50   Urinalysis, Routine w reflex microscopic     Status: None   Collection Time: 05/17/14  3:44 AM  Result Value Ref Range   Color, Urine YELLOW YELLOW   APPearance CLEAR CLEAR   Specific Gravity, Urine 1.005 1.005 - 1.030   pH 6.0 5.0 - 8.0   Glucose, UA NEGATIVE NEGATIVE mg/dL   Hgb urine dipstick NEGATIVE NEGATIVE   Bilirubin Urine NEGATIVE NEGATIVE   Ketones, ur NEGATIVE NEGATIVE mg/dL   Protein, ur NEGATIVE NEGATIVE mg/dL   Urobilinogen, UA 0.2 0.0 - 1.0 mg/dL   Nitrite NEGATIVE NEGATIVE   Leukocytes, UA NEGATIVE NEGATIVE    Comment: MICROSCOPIC NOT DONE ON URINES WITH NEGATIVE PROTEIN, BLOOD, LEUKOCYTES, NITRITE, OR GLUCOSE <1000 mg/dL.    Vitals: Blood pressure 122/60, pulse 91, temperature 97.8 F (36.6 C), temperature source Oral, resp. rate 15, SpO2 99 %.  Risk to Self: Suicidal Ideation: No Suicidal Intent: No Is patient at risk for suicide?: No Suicidal Plan?: No Access to Means: No Triggers for Past Attempts: None known Intentional Self Injurious Behavior: None Risk to Others: Homicidal Ideation: No Thoughts of Harm to Others: No Current Homicidal Intent: No Current Homicidal Plan: No Access to Homicidal Means: No History of harm to others?: No Assessment of Violence: None Noted Does patient have access to weapons?: No Criminal Charges Pending?: Yes Describe Pending Criminal Charges: dui Does patient have a court date:  Yes Court Date: 09/21/14 Prior Inpatient Therapy: Prior Inpatient Therapy: No Prior Outpatient Therapy: Prior Outpatient Therapy: Yes Prior Therapy Dates: 3/15 to 8/15 Prior Therapy [facility] Reason for Treatment: depression  No current facility-administered medications for this encounter.   Current Outpatient Prescriptions  Medication Sig Dispense Refill  . amphetamine-dextroamphetamine (ADDERALL) 20 MG tablet Take 20 mg by mouth daily.    . traZODone (DESYREL) 150 MG tablet Take 150 mg by mouth at bedtime as needed for sleep.      Musculoskeletal: Strength & Muscle Tone: within normal limits Gait & Station: unable to stand Patient leans: N/A  Psychiatric Specialty Exam:     Blood pressure 122/60, pulse 91, temperature 97.8 F (36.6 C), temperature source Oral, resp. rate 15, SpO2 99 %.There is no height or weight on file to calculate BMI.  General Appearance: Disheveled  Eye Contact::  Minimal  Speech:  Normal Rate  Volume:  Decreased  Mood:  Depressed  Affect:  Congruent  Thought Process:  drowsy  Orientation:  Full (Time, Place, and Person)  Thought Content:  WDL  Suicidal Thoughts:  Yes.  with intent/plan  Homicidal Thoughts:  No  Memory:  Immediate;   Fair Recent;   Fair Remote;   Fair  Judgement:  Impaired  Insight:  Lacking  Psychomotor Activity:  Decreased  Concentration:  Fair  Recall:  [employer] of Knowledge:Fair  Language: Good  Akathisia:  No  Handed:  Right  AIMS (if indicated):     Assets:  Leisure Time Physical Health Resilience Social Support  ADL's:  Intact  Cognition: WNL  Sleep:      Medical Decision Making: Review of Psycho-Social Stressors (1), Review or order clinical lab tests (1) and Review of Medication Regimen & Side Effects (2)  Treatment Plan Summary: Daily contact with patient to assess and evaluate symptoms and progress in treatment, Medication management and Plan admit to inpatient psychiatric unit  after medically cleared  Plan:  No evidence of imminent risk to self or others at present.   Disposition: Admit to inpatient psychiatric unit after medically cleared  Waylan Boga, PMH-NP 05/17/2014 10:49 AM

## 2014-05-17 NOTE — BH Assessment (Addendum)
Tele Assessment Note   Timothy Moses is an 29 y.o. male presented to [facility] after taking 8 of the 150 mg  trazadone. Per RN note, Pt was brought in by his friend. Pt reports taking the trazadone at midnight last night and his only intention was to get some sleep. Pt denies any SI and reports no prior attempts of hurting himself. Pt reports that he feels anxious everyday and has panic attacks rarely, the last one was in December of 2015. Pt reports a history of depression from March of 2015 to August of 2015. Pt reports seeing a therapist at [facility] during that time. Pt denies any current depression. Pt also denies SI, HI, any abuse, access to weapons, history of violence, and any family history of mental health issues. Pt reports using majiuana 6 to 8 times a month for a few years. Pt reports getting 20 mgs of adderall from his friend everyday. Pt reports drinking 24 oz of beer everyday and has a pending DUI with a court date on September 21, 2014. Pt reports going to [facility] for treatment in 2012 for 3 years and currently goes to [facility]. Pt's disposition is pending, providers will see him shorting and will provide a disposition at that time.   Axis I: F40.10 Social anxiety disorder   F10.10 Alcohol use disorder, mild  F12.10 Cannabis use disorder, mild Axis II: Deferred Axis III:  Past Medical History  Diagnosis Date  . ADHD (attention deficit hyperactivity disorder)   . Depression   . Anxiety    Axis IV: housing problems, problems related to legal system/crime, problems related to social environment and problems with access to health care services Axis V: 41-50 serious symptoms  Past Medical History:  Past Medical History  Diagnosis Date  . ADHD (attention deficit hyperactivity disorder)   . Depression   . Anxiety     History reviewed. No pertinent past surgical history.  Family History: No family history on file.  Social History:  reports that he has been smoking.  He does not  have any smokeless tobacco history on file. He reports that he drinks alcohol. He reports that he does not use illicit drugs.  Additional Social History:     CIWA: CIWA-Ar BP: (!) 100/49 mmHg Pulse Rate: 87 COWS:    PATIENT STRENGTHS: (choose at least two) Communication skills Physical Health Work skills  Allergies: No Known Allergies  Home Medications:  (Not in a hospital admission)  OB/GYN Status:  No LMP for male patient.  General Assessment Data Location of Assessment: [facility] Is this a Tele or Face-to-Face Assessment?: Face-to-Face Is this an Initial Assessment or a Re-assessment for this encounter?: Initial Assessment Living Arrangements: Non-relatives/Friends Can pt return to current living arrangement?: Yes Admission Status: Voluntary Is patient capable of signing voluntary admission?: Yes Transfer from: Home Referral Source: Self/Family/Friend  Medical Screening Exam [facility] Walk-in ONLY) Medical Exam completed: Yes  [facility] Crisis Care Plan Living Arrangements: Non-relatives/Friends  Education Status Is patient currently in school?: No Highest grade of school patient has completed: some college  Risk to self with the past 6 months Suicidal Ideation: No Suicidal Intent: No Is patient at risk for suicide?: No Suicidal Plan?: No Access to Means: No Previous Attempts/Gestures: No Triggers for Past Attempts: None known Intentional Self Injurious Behavior: None Family Suicide History: No Recent stressful life event(s):  (pt reports none) Persecutory voices/beliefs?: No Depression: No Substance abuse history and/or treatment for substance abuse?: Yes ([facility] in 2012 for  3 years and some AA) Suicide prevention information given to non-admitted patients: Not applicable  Risk to Others within the past 6 months Homicidal Ideation: No Thoughts of Harm to Others: No Current Homicidal Intent: No Current Homicidal Plan: No Access to Homicidal Means: No History of  harm to others?: No Assessment of Violence: None Noted Does patient have access to weapons?: No Criminal Charges Pending?: Yes Describe Pending Criminal Charges: dui Does patient have a court date: Yes Court Date: 09/21/14  Psychosis Hallucinations: None noted Delusions: None noted  Mental Status Report Appearance/Hygiene: In scrubs, Unremarkable Eye Contact: Poor Motor Activity: Rigidity, Freedom of movement Speech: Slow, Soft, Logical/coherent Level of Consciousness: Sleeping, Drowsy Mood: Other (Comment) (quiet and tired) Affect: Flat Anxiety Level: Panic Attacks Panic attack frequency: rarely Most recent panic attack: december 2015 Thought Processes: Coherent, Relevant Judgement: Impaired Orientation: Person, Place, Time, Situation Obsessive Compulsive Thoughts/Behaviors: None  Cognitive Functioning Concentration: Fair Memory: Recent Intact, Remote Intact IQ: Average Insight: Fair Impulse Control: Fair Sleep: Decreased Total Hours of Sleep:  (none without sleep aid) Vegetative Symptoms: None  ADLScreening [facility] Assessment Services) Patient's cognitive ability adequate to safely complete daily activities?: Yes Patient able to express need for assistance with ADLs?: No Independently performs ADLs?: Yes (appropriate for developmental age)  Prior Inpatient Therapy Prior Inpatient Therapy: No  Prior Outpatient Therapy Prior Outpatient Therapy: Yes Prior Therapy Dates: 3/15 to 8/15 Prior Therapy Facilty/Provider(s): [facility] Reason for Treatment: depression  ADL Screening (condition at time of admission) Patient's cognitive ability adequate to safely complete daily activities?: Yes Is the patient deaf or have difficulty hearing?: No Does the patient have difficulty seeing, even when wearing glasses/contacts?: No Does the patient have difficulty concentrating, remembering, or making decisions?: No Patient able to express need for assistance with ADLs?: No Does  the patient have difficulty dressing or bathing?: No Independently performs ADLs?: Yes (appropriate for developmental age) Does the patient have difficulty walking or climbing stairs?: No       Abuse/Neglect Assessment (Assessment to be complete while patient is alone) Physical Abuse: Denies Verbal Abuse: Denies Sexual Abuse: Denies Exploitation of patient/patient's resources: Denies Self-Neglect: Denies Values / Beliefs Cultural Requests During Hospitalization: None Spiritual Requests During Hospitalization: None Consults Spiritual Care Consult Needed: No Social Work Consult Needed: No Merchant navy officerAdvance Directives (For Healthcare) Does patient have an advance directive?: No Would patient like information on creating an advanced directive?: No - patient declined information    Additional Information 1:1 In Past 12 Months?: No CIRT Risk: No Elopement Risk: No Does patient have medical clearance?: Yes     Disposition:  Disposition Initial Assessment Completed for this Encounter: Yes  Rollen SoxMary Orilla Templeman, MA, Willaim RayasLPCA, LCASA Therapeutic Triage Specialist [facility]   05/17/2014 8:28 AM

## 2014-05-17 NOTE — Progress Notes (Signed)
Patient listed as [place] having a pcp [place].  EDCM provide [facility], informed patient of services there and walk in times.  EDCM also provided patient with list of pcps who accept self pay patients, list of discount pharmacies and websites needymeds.org and GoodRX.com for medication assistance, phone number to inquire about the orange card, phone number to inquire about Mediciad, phone number to inquire about the Affordable Care Act, financial resources in the community such as local churches, salvation army, urban ministries, and dental assistance for uninsured patients. Patient asleep at this time.  EDCM placed resources on bedside table.  EDCM did not wake patient at this time. No further EDCM needs at this time.

## 2014-05-17 NOTE — ED Notes (Signed)
Bed: WA14 Expected date:  Expected time:  Means of arrival:  Comments: Res B 

## 2014-05-17 NOTE — ED Notes (Signed)
Pt transported to [facility] by [facility] transportation service for continuation of specialized care. Belongings given to driver after patient signed for them. Pt left in no acute distress. 

## 2014-05-17 NOTE — ED Notes (Signed)
Pt sleeping, placed on 2L O2 via [place]. O2 sats improved 96% on 2L O2.

## 2014-05-17 NOTE — Progress Notes (Signed)
Per psychiatrist and NP patient recommended for inpatient treatment.   Olga CoasterKristen Armilda Vanderlinden, LCSW  Clinical Social Work  [facility] [phone]

## 2014-05-17 NOTE — ED Notes (Signed)
Pt changed into burgundy paper scrubs and wanded by security.

## 2014-05-17 NOTE — ED Notes (Signed)
MD at bedside. Psych team 

## 2014-05-17 NOTE — ED Notes (Signed)
Attempted TTS consult via computer but patient was to sleepy to talk to counselor.

## 2014-05-17 NOTE — ED Notes (Signed)
Mother, Freada BergeronMaureen Martin, home # [phone], cell # [phone]. If called and no answer leave message and will return call.

## 2014-05-17 NOTE — ED Notes (Signed)
Update to David/Poison Control

## 2014-05-17 NOTE — ED Notes (Signed)
RT at Bedside 

## 2014-05-17 NOTE — ED Notes (Signed)
Pt brought in by friends that say he overdosed  Pt is lethargic upon arrival to res B  Pt states he took 8 trazadone tablets at 2200  Dr Lynelle DoctorKnapp in room to assess pt

## 2014-05-17 NOTE — ED Notes (Signed)
Patient refused to have a temp foley placed. Pt states he has the right to refuse. Pt is alert, oriented to himself, place, and what medication he took.

## 2014-05-18 ENCOUNTER — Encounter (HOSPITAL_COMMUNITY): Payer: Self-pay | Admitting: Psychiatry

## 2014-05-18 DIAGNOSIS — F1020 Alcohol dependence, uncomplicated: Secondary | ICD-10-CM | POA: Diagnosis present

## 2014-05-18 DIAGNOSIS — Y929 Unspecified place or not applicable: Secondary | ICD-10-CM

## 2014-05-18 DIAGNOSIS — T43212A Poisoning by selective serotonin and norepinephrine reuptake inhibitors, intentional self-harm, initial encounter: Secondary | ICD-10-CM

## 2014-05-18 DIAGNOSIS — F332 Major depressive disorder, recurrent severe without psychotic features: Principal | ICD-10-CM

## 2014-05-18 DIAGNOSIS — F909 Attention-deficit hyperactivity disorder, unspecified type: Secondary | ICD-10-CM | POA: Diagnosis present

## 2014-05-18 DIAGNOSIS — F4010 Social phobia, unspecified: Secondary | ICD-10-CM | POA: Diagnosis present

## 2014-05-18 LAB — TSH: TSH: 0.641 u[IU]/mL (ref 0.350–4.500)

## 2014-05-18 MED ORDER — NICOTINE 21 MG/24HR TD PT24
21.0000 mg | MEDICATED_PATCH | Freq: Every day | TRANSDERMAL | Status: DC
  Administered 2014-05-18 – 2014-05-23 (×6): 21 mg via TRANSDERMAL
  Filled 2014-05-18 (×9): qty 1

## 2014-05-18 NOTE — [facility] Group Notes (Signed)
[facility] Note   05/18/2014 10:29 AM  Participation Quality:  Invited-DID NOT ATTEND-pt in room sleeping  Moses, Herbert SetaHeather LCSWA
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
## 2014-05-18 NOTE — Clinical Social Work Note (Signed)
[facility] and [facility] referrals made per pt request.  Trula SladeHeather Smart, LCSWA 05/18/2014 3:47 PM

## 2014-05-18 NOTE — Progress Notes (Signed)
D:Patient in the hallway on approach.  Patient states he had a good day.  Patient states his anxiety is still high but states Vistaril is helping.  Patient states his goal for today was to feel better.  Patient states he met his goal for today.  Patient did admit hat when he took the OD of Trazodone he was trying to hurt himself but states he no longer feels this way.  Patient denies SI/HI and denies AVH.  Patient states he would like to go to a 28 day program.  Patient pleasant and cooperative during assessment. A: Staff to monitor Q 15 mins for safety.  Encouragement and support offered.  Scheduled medications administered per orders.  Vistaril administered prn for Anxiety.   R: Patient remains safe on the unit.  Patient attended group tonight.  Patient visible on the unit and interacting with peers.  Patient taking administered medications.

## 2014-05-18 NOTE — Progress Notes (Signed)
Recreation Therapy Notes  Date: 04.06.2016 Time: 9:30am Location: 300 Hall Group Room   Group Topic: Stress Management  Goal Area(s) Addresses:  Patient will actively participate in stress management techniques presented during session.   Behavioral Response: Did not attend.   Marykay Lexenise L Laretta Pyatt, LRT/CTRS  Kilyn Maragh L 05/18/2014 2:11 PM

## 2014-05-18 NOTE — Progress Notes (Signed)
[facility] and cooperative. "I need to learn how to deal with my depression and anxiety". Pt presented to ED with family and reported taking 8 trazodone pills, drinking alcohol, adderall, cocaine, benzodiazepines and THC. Pt adamantly denies suicide attempt and states he just wanted to sleep.  -SI/HI, -A/[place], verbally contracts for safety. Admits to feeling anxious and depressed. Pt has a court date on 09/21/14 for DUI. Emotional support and encouragement given. Pt admitted for evaluation, stabilization and reduction of baseline. Will monitor closely.

## 2014-05-18 NOTE — BHH Counselor (Signed)
Adult Comprehensive Assessment  Patient ID: Timothy Moses. Sesay, male   DOB: Oct 30, 1985, 29 y.o.   MRN: [id number]  Information Source: Information source: Patient  Current Stressors:  Employment / Job issues: unsure of employment status at this time.  Financial / Lack of resources (include bankruptcy): no insurance. "I don't think I'll have a job when I leave here.'  Housing / Lack of housing: kicked out of friends home. may be able to stay with his mother if needed.  Physical health (include injuries & life threatening diseases): none identified  Bereavement / Loss: none identified   Living/Environment/Situation:  Living Arrangements: Other relatives Living conditions (as described by patient or guardian): pt has been living with his best friend, friend's fiance, and their two kids for the past three months.  How long has patient lived in current situation?: three months.  What is atmosphere in current home: Temporary  Family History:  Marital status: Single Does patient have children?: No  Childhood History:  By whom was/is the patient raised?: Mother Additional childhood history information: pt's parents were divorced when he was mother was primary caregiver. "I saw my dad maybe twice from age 46 to 62, when he had an aneurism and has been hospitalized since then in [place]." Pt reports that father was an alcoholic  Description of patient's relationship with caregiver when they were a child: close to mother. no relationship with father Patient's description of current relationship with people who raised him/her: close to mother; strained and distant relationship with father who is still hospitalized after 20 years.  Does patient have siblings?: Yes Number of Siblings: 1 Description of patient's current relationship with siblings: older brother-strained relationship due to pt's substance abuse problems.  Did patient suffer any verbal/emotional/physical/sexual abuse as a child?: No Did patient  suffer from severe childhood neglect?: No Has patient ever been sexually abused/assaulted/raped as an adolescent or adult?: No Was the patient ever a victim of a crime or a disaster?: No Witnessed domestic violence?: No Has patient been effected by domestic violence as an adult?: No  Education:  Highest grade of school patient has completed: some college "I have about 40 hours left to complete." [employer] in [place]  Currently a student?: No Learning disability?: No  Employment/Work Situation:   Employment situation: Employed Where is patient currently employed?: [employer]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 What is the longest time patient has a held a job?: see above Where was the patient employed at that time?: see above  Has patient ever been in the [employer]?: No Has patient ever served in combat?: No  Financial Resources:   Financial resources: Income from employment, Support from parents / caregiver Does patient have a representative payee or guardian?: No  Alcohol/Substance Abuse:   What has been your use of drugs/alcohol within the last 12 months?: alcohol-about a 12 pk a day for past few years. "I binge drink on weekend and black out alot." abusing adderral for several months-"I take about  a day." marijuana and cocaine-infrequently. few times per month-recent use.  If attempted suicide, did drugs/alcohol play a role in this?: Yes (pt intoxicated when he attempted to overdose and reports past problems with this) Alcohol/Substance Abuse Treatment Hx: Denies past history If yes, describe treatment: n/a  Has alcohol/substance abuse ever caused legal problems?: Yes (  DUI-court date in Aug 2016)  Social Support System:   Patient's Community Support System: Good Describe Community Support  System: pt reports great social support network. I have alot of people that I can call when I'm in trouble and need help Type of faith/religion: n/a  How does patient's faith help to cope with current illness?: n/a   Leisure/Recreation:   Leisure and Hobbies: going out with friends, partying   Strengths/Needs:   What things does the patient do well?: working; painting; power washing In what areas does patient struggle / problems for patient: coping with depression and thoughts of SI; alcoholism (runs in family (father)) substance abuse "self medicating" to reduce anxiety and depression.   Discharge Plan:   Does patient have access to transportation?: Yes Will patient be returning to same living situation after discharge?: No Plan for living situation after discharge: pt [facility]  Currently receiving community mental health services: No If no, would patient like referral for services when discharged?: Yes (What county?) ([place]) Does patient have financial barriers related to discharge medications?: Yes Patient description of barriers related to discharge medications: limited income/no insurance  Summary/Recommendations:    Pt is 29 year old male [place] [facility] overdose and due to polysubstance abuse (alcohol, marijuana, cocaine, adderal), depression and SI, and for medication stabilization. Pt currently denies SI/HI/AVH. 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 Recommendations for pt include: crisis stabilization, therapeutic milieu, encourage group attendance and participation, ativan taper for withdrawals, and medication stabilization for mood stability. Pt would also benefit from comprehensive mental wellness/sobriety plan. [facility] and [facility]. CSW assessing for appropriate referrals at this time.   Smart, Gerard Cantara  LCSWA 05/18/2014 4:16 PM

## 2014-05-18 NOTE — B[facility] Suicide Risk Assessment (Signed)
[facility]   Nursing information obtained from:  Patient Demographic factors:  Adolescent or young adult, Caucasian Current Mental Status:  NA Loss Factors:  Legal issues Historical Factors:  NA Risk Reduction Factors:  Positive social support Total Time spent with patient: 1 hour Principal Problem: <principal problem not specified> Diagnosis:   Patient Active Problem List   Diagnosis Date Noted  . Alcohol dependence [F10.20] 05/18/2014  . ADHD (attention deficit hyperactivity disorder) [F90.9] 05/18/2014  . Social anxiety disorder [F40.10] 05/18/2014  . Polysubstance dependence [F19.20] 05/17/2014  . Overdose [T50.901A] 05/17/2014  . 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 Substance induced mood disorder [F19.94] 05/17/2014  . Drug ingestion [T50.901A]      Continued Clinical Symptoms:  Alcohol Use Disorder Identification Test Final Score (AUDIT): 10 The "Alcohol Use Disorders Identification Test", Guidelines for Use in Primary Care, Second Edition.  World Science writerHealth Organization [facility](WHO). Score between 0-7:  no or low risk or alcohol related problems. Score between 8-15:  moderate risk of alcohol related problems. Score between 16-19:  high risk of alcohol related problems. Score 20 or above:  warrants further diagnostic evaluation for alcohol dependence and treatment.   CLINICAL FACTORS:   Severe Anxiety and/or Agitation Alcohol/Substance Abuse/Dependencies  Psychiatric Specialty Exam: Physical Exam  ROS  Blood pressure 128/85, pulse 86, temperature 98.1 F (36.7 C), temperature source Oral, resp. rate 16, height 6\' 1"  (1.854 m), weight 70.761 kg (156 lb).Body mass index is 20.59 kg/(m^2).   COGNITIVE FEATURES THAT CONTRIBUTE TO RISK:  Closed-mindedness, Polarized thinking and Thought constriction (tunnel vision)    SUICIDE RISK:   Moderate:  Frequent suicidal ideation with limited intensity, and duration, some specificity in  terms of plans, no associated intent, good self-control, limited dysphoria/symptomatology, some risk factors present, and identifiable protective factors, including available and accessible social support.  PLAN OF CARE: Supportive approach/coping skills/relapse prevention                              Alcohol detox protocol, reassess and address the co morbities                               See H and P Medical Decision Making:  Review of Psycho-Social Stressors (1), Review or order clinical lab tests (1), Review of Medication Regimen & Side Effects (2) and Review of New Medication or Change in Dosage (2)  I certify that inpatient services furnished can reasonably be expected to improve the patient's condition.   Timothy Moses A 05/18/2014, 4:53 PM

## 2014-05-18 NOTE — Tx Team (Signed)
Initial Interdisciplinary Treatment Plan   PATIENT STRESSORS: Legal issue Substance abuse   PATIENT STRENGTHS: Ability for insight Average or above average intelligence Communication skills Supportive family/friends   PROBLEM LIST: Problem List/Patient Goals Date to be addressed Date deferred Reason deferred Estimated date of resolution  anxiety 05/18/14   At d/c  Depression "to learn how to deal with depression and anxiety" 05/18/14   At d/c  Increased risk for suicide  05/18/14   At d/c  substance 05/18/14   At d/c                                 DISCHARGE CRITERIA:  Improved stabilization in mood, thinking, and/or behavior Need for constant or close observation no longer present  PRELIMINARY DISCHARGE PLAN: Outpatient therapy Return to previous living arrangement  PATIENT/FAMIILY INVOLVEMENT: This treatment plan has been presented to and reviewed with the patient, Timothy Moses.  The patient and family have been given the opportunity to ask questions and make suggestions.  Timothy Moses, Timothy Moses 05/18/2014, 2:49 AM

## 2014-05-18 NOTE — BHH Group Notes (Signed)
BHH LCSW Group Therapy  05/18/2014 3:00 PM  Type of Therapy:  Group Therapy  Participation Level:  Active  Participation Quality:  Attentive  Affect:  Appropriate  Cognitive:  Alert and Oriented  Insight:  Engaged  Engagement in Therapy:  Engaged  Modes of Intervention:  Confrontation, Discussion, Education, Exploration, Problem-solving, Rapport Building, Socialization and Support  Summary of Progress/Problems: Emotion Regulation: This group focused on both positive and negative emotion identification and allowed group members to process ways to identify feelings, regulate negative emotions, and find healthy ways to manage internal/external emotions. Group members were asked to reflect on a time when their reaction to an emotion led to a negative outcome and explored how alternative responses using emotion regulation would have benefited them. Group members were also asked to discuss a time when emotion regulation was utilized when a negative emotion was experienced. Timothy Moses was attentive and engaged during today's processing group. He shared that he bottles up his anxiety, which "eventually turns into anger" and uses alcohol to cope with his anxiety. Pt also reports passive SI "on and off" for the past few years and acknowledges his need for medication management in addition to substance abuse therapy. Timothy Moses stated that he has a good support system but and feels safe coming to those people for help when in crisis.   Smart, [name] LCSWA  05/18/2014, 3:00 PM

## 2014-05-18 NOTE — Progress Notes (Signed)
Patient ID: Timothy Moses, male   DOB: 06-16-1985, 29 y.o.   MRN: [id number] D: Patient denies SI/HI and auditory and visual hallucinations. Patient has a depressed mood and affect but rates depression only as 2 on 1 to 10 scale. Reports poor appetite but good sleep. .  . A: Patient given emotional support from RN. Patient given medications per MD orders. Patient encouraged to attend groups and unit activities. Patient encouraged to come to staff with any questions or concerns.  R: Patient remains cooperative and appropriate.  Patient remains in room. Will continue to monitor patient for safety.

## 2014-05-18 NOTE — Tx Team (Signed)
Interdisciplinary Treatment Plan Update (Adult)   Date: 05/18/2014   Time Reviewed: 8:37 AM  Progress in Treatment:  Attending groups: No Participating in groups:  No.  Taking medication as prescribed: Yes  Tolerating medication: Yes  Family/Significant othe contact made: Not yet. SPE required for this pt.   Patient understands diagnosis: Yes, AEB seeking treatment for anxiety, depression, recent overdose (pt states unintentional), and for medication stabilization.  Discussing patient identified problems/goals with staff: Yes  Medical problems stabilized or resolved: Yes  Denies suicidal/homicidal ideation: Yes during self report.  Patient has not harmed self or Others: Yes  New problem(s) identified:  Discharge Plan or Barriers: Pt is not attending d/c planning group or getting out of bed at this time due to drowsiness. PSA needed. CSW to attempt PSA this afternoon.  Additional comments: Timothy Moses is an 29 y.o. male [facility] taking 8 of the 150 mg trazadone. Per RN note, Pt was brought in by his friend. Pt reports taking the trazadone at midnight last night and his only intention was to get some sleep. Pt denies any SI and reports no prior attempts of hurting himself. Pt reports that he feels anxious everyday and has panic attacks rarely, the last one was in December of 2015. Pt reports a history of depression from March of 2015 to August of 2015. [facility] during that time. Pt denies any current depression. Pt also denies SI, HI, any abuse, access to weapons, history of violence, and any family history of mental health issues. Pt reports using majiuana 6 to 8 times a month for a few years. Pt reports getting 20 mgs of adderall from his friend everyday. Pt reports drinking 24 oz of beer everyday and has a pending DUI with a court date on September 21, 2014. Pt reports going to [facility] for treatment in 2012 for 3 years and currently [facility]. Reason for Continuation of Hospitalization: Ativan taper-withdrawals Mood stabilization Medication management  Estimated length of stay: 3-5 days  For review of initial/current patient goals, please see plan of care.  Attendees:  Patient:    Family:    Physician: Dr. Dub MikesLugo MD 05/18/2014 10:38 AM   Nursing: Alexia FreestonePatty RN; Kim RN 05/18/2014 10:38 AM   Clinical Social Worker Goran Olden Smart, LCSWA  05/18/2014 10:38 AM   Other: Earley AbideKristin D. LCSWA; Gerilyn PilgrimQuylle H. LCSW 05/18/2014 10:39 AM   Other: Darden DatesJennifer C. Nurse CM 05/18/2014 10:39 AM   Other: Liliane Badeolora Sutton, Community Care Coordinator  05/18/2014 10:39 AM   Other:    Scribe for Treatment Team:  The Sherwin-WilliamsHeather Smart LCSWA 05/18/2014 10:40 AM

## 2014-05-19 MED ORDER — ESCITALOPRAM OXALATE 5 MG PO TABS
5.0000 mg | ORAL_TABLET | Freq: Every day | ORAL | Status: DC
  Administered 2014-05-19 – 2014-05-21 (×3): 5 mg via ORAL
  Filled 2014-05-19 (×5): qty 1

## 2014-05-19 MED ORDER — ESCITALOPRAM OXALATE 10 MG PO TABS
ORAL_TABLET | ORAL | Status: AC
  Filled 2014-05-19: qty 1

## 2014-05-19 MED ORDER — HYDROXYZINE HCL 25 MG PO TABS
25.0000 mg | ORAL_TABLET | Freq: Four times a day (QID) | ORAL | Status: DC | PRN
  Administered 2014-05-19 – 2014-05-23 (×10): 25 mg via ORAL
  Filled 2014-05-19 (×3): qty 1
  Filled 2014-05-19: qty 30
  Filled 2014-05-19 (×7): qty 1

## 2014-05-19 NOTE — Progress Notes (Signed)
Patient ID: Timothy Moses, male   DOB: 1985-03-12, 29 y.o.   MRN: [id number] D: Patient denies SI/HI and auditory and visual hallucinations. Patient reports he is extremely anxious today. Attended group but did not participate and was observed sleeping during speaker.  A: Patient given emotional support from RN. Patient given medications per MD orders. PRNs given for anxiety. Patient encouraged to come to staff with any questions or concerns.  R: Patient remains cooperative and appropriate. Patient reports Vistaril helps.Will continue to monitor patient for safety.

## 2014-05-19 NOTE — [facility] Group Notes (Signed)
B[facility] LCSW Group Therapy 05/19/2014 1:15 PM Type of Therapy: Group Therapy Participation Level: Minimal  Participation Quality: Minimal  Affect: Depressed and Flat  Cognitive: Alert and Oriented  Insight: Developing/Improving and Engaged  Engagement in Therapy: Developing/Improving and Engaged  Modes of Intervention: Activity, Clarification, Confrontation, Discussion, Education, Exploration, Limit-setting, Orientation, Problem-solving, Rapport Building, Dance movement psychotherapisteality Testing, Socialization and Support  Summary of Progress/Problems: Patient was attentive and engaged with speaker from [employer]. Patient was attentive to speaker while they shared their story of dealing with mental health and overcoming it. Patient expressed interest in their programs and services and received information on their agency. Patient processed ways they can relate to the speaker. Patient was observed sleeping through the majority of speaker's presentation.  Samuella BruinKristin Mehul Rudin, MSW, [employer]CSWA Clinical Social Worker [facility] [phone]

## 2014-05-19 NOTE — Progress Notes (Signed)
D:Patient in the dayroom on approach.  Patient states he had a good day but states he has had a lot of anxiety.  Patient states Vistaril has helped with his anxiety.  Patient calm and cooperative.  Patient states he wants to move forward with his life.  Patient states he has been interacting with peers and coloring and it has been relaxing.  Patient states SI/HI and denies AVH. A: Staff to monitor Q 15 mins for safety.  Encouragement and support offered.  Scheduled medications administered per orders.  Vistaril administered prn for anxiety.   R: Patient remains safe on the unit.  Patient attended group tonight.  Patient visible on the unit and interacting with peers.  Patient taking administered medications.

## 2014-05-19 NOTE — Progress Notes (Signed)
Pt attended and was engaged in karaoke. 

## 2014-05-19 NOTE — Progress Notes (Signed)
[facility] MD Progress Note  05/19/2014 4:02 PM Timothy Moses  MRN:  [id number] Subjective:  Timothy Moses states that he has been dealing with these issues/symptoms for a while and that he recognizes that they are not going to get any better on their own. He states he is committed to start by abstaining. He has dealt with the anxiety and the depression for a long time and alcohol has been the way he has cope and dealt with them.  Principal Problem: <principal problem not specified> Diagnosis:   Patient Active Problem List   Diagnosis Date Noted  . Alcohol dependence [F10.20] 05/18/2014  . ADHD (attention deficit hyperactivity disorder) [F90.9] 05/18/2014  . Social anxiety disorder [F40.10] 05/18/2014  . Polysubstance dependence [F19.20] 05/17/2014  . Overdose [T50.901A] 05/17/2014  . 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 Substance induced mood disorder [F19.94] 05/17/2014  . Drug ingestion [T50.901A]    Total Time spent with patient: 30 minutes   Past Medical History:  Past Medical History  Diagnosis Date  . ADHD (attention deficit hyperactivity disorder)   . Depression   . Anxiety    History reviewed. No pertinent past surgical history. Family History: History reviewed. No pertinent family history. Social History:  History  Alcohol Use  . Yes    Comment: weekends     History  Drug Use No    Comment: denies    History   Social History  . Marital Status: Significant Other    Spouse Name: N/A  . Number of Children: N/A  . Years of Education: N/A   Social History Main Topics  . Smoking status: Current Every Day Smoker  . Smokeless tobacco: Not on file  . Alcohol Use: Yes     Comment: weekends  . Drug Use: No     Comment: denies  . Sexual Activity: Not on file   Other Topics Concern  . None   Social History Narrative   Additional History:    Sleep: Fair  Appetite:  Fair   Assessment:   Musculoskeletal: Strength & Muscle Tone: within normal  limits Gait & Station: normal Patient leans: N/A   Psychiatric Specialty Exam: Physical Exam  Review of Systems  Constitutional: Negative.   HENT: Negative.   Eyes: Negative.   Respiratory: Negative.   Cardiovascular: Negative.   Gastrointestinal: Negative.   Genitourinary: Negative.   Musculoskeletal: Negative.   Skin: Negative.   Neurological: Negative.   Endo/Heme/Allergies: Negative.   Psychiatric/Behavioral: Positive for depression and substance abuse. The patient is nervous/anxious.     Blood pressure 137/72, pulse 90, temperature 97.5 F (36.4 C), temperature source Oral, resp. rate 16, height  (1.854 m), weight 70.761 kg (156 lb).Body mass index is 20.59 kg/(m^2).  General Appearance: Fairly Groomed  Patent attorney::  Fair  Speech:  Clear and Coherent  Volume:  Decreased  Mood:  Anxious and Depressed  Affect:  anxious worried  Thought Process:  Coherent and Goal Directed  Orientation:  Full (Time, Place, and Person)  Thought Content:  symptoms events worries concerns  Suicidal Thoughts:  No  Homicidal Thoughts:  No  Memory:  Immediate;   Fair Recent;   Fair Remote;   Fair  Judgement:  Fair  Insight:  Present  Psychomotor Activity:  Restlessness  Concentration:  Fair  Recall:  [employer] of Knowledge:Fair  Language: Fair  Akathisia:  No  Handed:  Right  AIMS (if indicated):     Assets:  Desire for Improvement Social Support  ADL's:  Intact  Cognition: WNL  Sleep:  Number of Hours: 6.25     Current Medications: Current Facility-Administered Medications  Medication Dose Route Frequency Provider Last Rate Last Dose  . acetaminophen (TYLENOL) tablet 650 mg  650 mg Oral Q6H PRN Kerry Hough, PA-C      . alum & mag hydroxide-simeth (MAALOX/MYLANTA) 200-200-20 MG/5ML suspension 30 mL  30 mL Oral Q4H PRN Kerry Hough, PA-C      . doxepin (SINEQUAN) capsule 50 mg  50 mg Oral QHS,MR X 1 Spencer E Simon, PA-C   50 mg at 05/18/14 2245  . escitalopram  (LEXAPRO) 10 MG tablet           . escitalopram (LEXAPRO) tablet 5 mg  5 mg Oral Daily Rachael Fee, MD   5 mg at 05/19/14 1210  . hydrOXYzine (ATARAX/VISTARIL) tablet 25 mg  25 mg Oral Q6H PRN Rachael Fee, MD   25 mg at 05/19/14 1438  . loperamide (IMODIUM) capsule 2-4 mg  2-4 mg Oral PRN Kerry Hough, PA-C      . LORazepam (ATIVAN) tablet 1 mg  1 mg Oral Q6H PRN Kerry Hough, PA-C      . LORazepam (ATIVAN) tablet 1 mg  1 mg Oral TID Rachael Fee, MD   1 mg at 05/19/14 1210   Followed by  . [START ON 05/20/2014] LORazepam (ATIVAN) tablet 1 mg  1 mg Oral BID Rachael Fee, MD       Followed by  . [START ON 05/22/2014] LORazepam (ATIVAN) tablet 1 mg  1 mg Oral Daily Rachael Fee, MD      . magnesium hydroxide (MILK OF MAGNESIA) suspension 30 mL  30 mL Oral Daily PRN Kerry Hough, PA-C      . multivitamin with minerals tablet 1 tablet  1 tablet Oral Daily Kerry Hough, PA-C   1 tablet at 05/19/14 0755  . nicotine (NICODERM CQ - dosed in mg/24 hours) patch 21 mg  21 mg Transdermal Daily Kerry Hough, PA-C   21 mg at 05/19/14 0759  . ondansetron (ZOFRAN-ODT) disintegrating tablet 4 mg  4 mg Oral Q6H PRN Kerry Hough, PA-C      . thiamine (B-1) injection 100 mg  100 mg Intramuscular Once Kerry Hough, PA-C   100 mg at 05/17/14 2215  . thiamine (VITAMIN B-1) tablet 100 mg  100 mg Oral Daily Kerry Hough, PA-C   100 mg at 05/19/14 0454    Lab Results:  Results for orders placed or performed during the hospital encounter of 05/17/14 (from the past 48 hour(s))  TSH     Status: None   Collection Time: 05/18/14  6:22 AM  Result Value Ref Range   TSH 0.641 0.350 - 4.500 uIU/mL    Comment: Performed at [facility]    Physical Findings: AIMS: Facial and Oral Movements Muscles of Facial Expression: None, normal Lips and Perioral Area: None, normal Jaw: None, normal Tongue: None, normal,Extremity Movements Upper (arms, wrists, hands, fingers): None,  normal Lower (legs, knees, ankles, toes): None, normal, Trunk Movements Neck, shoulders, hips: None, normal, Overall Severity Severity of abnormal movements (highest score from questions above): None, normal Incapacitation due to abnormal movements: None, normal Patient's awareness of abnormal movements (rate only patient's report): No Awareness, Dental Status Current problems with teeth and/or dentures?: No Does patient usually wear dentures?: No  CIWA:  CIWA-Ar Total: 2 COWS:  Treatment Plan Summary: Daily contact with patient to assess and evaluate symptoms and progress in treatment and Medication management  Supportive approach/copig skills Alcohol dependence: continue Ativan Detox protocol/will develop a relapse prevention plan Anxiety; will use the Vistaril and work with CBT/mindfulness Depression; will start Lexapro 5 mg daily ( he states that his mother has done well on it) Will explore residential treatment programs  Medical Decision Making:  Review of Psycho-Social Stressors (1), Review of Medication Regimen & Side Effects (2) and Review of New Medication or Change in Dosage (2)     Yaelis Scharfenberg A 05/19/2014, 4:02 PM

## 2014-05-19 NOTE — [facility] Group Notes (Signed)
[facility] Group Notes:  (Nursing/MHT/Case Management/Adjunct)  Date:  05/19/2014  Time:  0900  Type of Therapy:  Nurse Education  Participation Level:  Active  Participation Quality:  Attentive  Affect:  Appropriate  Cognitive:  Alert  Insight:  Lacking  Engagement in Group:  Lacking  Modes of Intervention:  Discussion and Education  Summary of Progress/Problems:  Layla BarterWhite, Ryver Zadrozny L 05/19/2014, 9:31 AM

## 2014-05-19 NOTE — Clinical Social Work Note (Addendum)
[facility] Trey PaulaJeff regarding referral. Awaiting return call.  [facility] regarding referral. Awaiting return call.   Samuella BruinKristin Marra Fraga, MSW, [employer] IncLCSWA Clinical Social Worker [facility] [phone]

## 2014-05-20 NOTE — [facility] Group Notes (Signed)
[facility] LCSW Group Therapy  05/20/2014 12:42 PM  Type of Therapy:  Group Therapy  Participation Level:  Active  Participation Quality:  Attentive  Affect:  Appropriate  Cognitive:  Alert and Oriented  Insight:  Improving  Engagement in Therapy:  Engaged  Modes of Intervention:  Confrontation, Discussion, Education, Exploration, Problem-solving, Rapport Building, Socialization and Support  Summary of Progress/Problems: Feelings around Relapse. Group members discussed the meaning of relapse and shared personal stories of relapse, how it affected them and others, and how they perceived themselves during this time. Group members were encouraged to identify triggers, warning signs and coping skills used when facing the possibility of relapse. Social supports were discussed and explored in detail. Post Acute Withdrawal Syndrome (handout provided) was introduced and examined. Pt's were encouraged to ask questions, talk about key points associated with PAWS, and process this information in terms of relapse prevention. Timothy Moses was attentive and engaged during today's processing group. He shared his past experiences involving relapse and experiences with PAWS symptoms. Timothy Moses stated that during "the bad days," he needs to keep himself distracted and busy with work and meetings in order to best avoid relapse. He reports that his mother and some friends are 'very supportive' of him [facility] further treatment.    Smart, Damire Remedios LCSWA  05/20/2014, 12:42 PM

## 2014-05-20 NOTE — Progress Notes (Signed)
D) pt came to the medication window and stated "I am anxious. Was able to verbalize that he was feeling anxious and upset over the fact he had spoken with a long term care facility and when they asked him what was the least amount of alcohol he might drink in a day, Pt responded by telling them it might be 4 beers. He was then told that he could not be inpatient. He could only be treated outpatient. Pt's anxiety increased and he was upset. A) Provided Pt with a 1:1. Encouraged Pt to verbalize his feelings and fears. Questioned Pt further about his drinking and drugging patterns over a years time.Asked Pt for his truth about the amount he uses. Pt stated he wasn't sure if they would accept him. Provided with his prn of Ativan. R) Pt was able to understand and process and answer questions about himself and understands that he must be completely honest with the long term care facilities and share what he choices have been and that he now wants to be clean.

## 2014-05-20 NOTE — Consult Note (Signed)
D: Pt denies SI/HI/AV. Pt is pleasant and cooperative. Pt rates depression at a 7, anxiety at a 9, and Helplessness/hopelessness at a 3.  A: Pt was offered support and encouragement. Pt was given scheduled medications. Pt was encourage to attend groups. Q 15 minute checks were done for safety.  R:Pt attends groups and interacts well with peers and staff. Pt taking medication. Pt is having some anxiety due to withdrawal and given a prn. Pt receptive to treatment and safety maintained on unit.

## 2014-05-20 NOTE — Progress Notes (Signed)
[facility] MD Progress Note  05/20/2014 3:27 PM Brinson M. Boomhower  MRN:  [id number] Subjective:  Timothy Moses endorses that he is having a hard time. He is more aware of his depression. He admits he is upset about his friend "kicking him out" but at the same time recognizes that his own behavior, his choosing to drink the way he did, caused his friends reaction. He states that as reality hits him, he gets more upset worried fearful of what is going to happen once he gets out of here. He is concerned about his impulsivity his addictive behavior.  States that he is challenging himself to go to groups be around other patients as the tendency is to isolate. Hopes to be able to go for further in house treatement Principal Problem: <principal problem not specified> Diagnosis:   Patient Active Problem List   Diagnosis Date Noted  . Alcohol dependence [F10.20] 05/18/2014  . ADHD (attention deficit hyperactivity disorder) [F90.9] 05/18/2014  . Social anxiety disorder [F40.10] 05/18/2014  . Polysubstance dependence [F19.20] 05/17/2014  . Overdose [T50.901A] 05/17/2014  . 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 Substance induced mood disorder [F19.94] 05/17/2014  . Drug ingestion [T50.901A]    Total Time spent with patient: 30 minutes   Past Medical History:  Past Medical History  Diagnosis Date  . ADHD (attention deficit hyperactivity disorder)   . Depression   . Anxiety    History reviewed. No pertinent past surgical history. Family History: History reviewed. No pertinent family history. Social History:  History  Alcohol Use  . Yes    Comment: weekends     History  Drug Use No    Comment: denies    History   Social History  . Marital Status: Significant Other    Spouse Name: N/A  . Number of Children: N/A  . Years of Education: N/A   Social History Main Topics  . Smoking status: Current Every Day Smoker  . Smokeless tobacco: Not on file  . Alcohol Use: Yes     Comment:  weekends  . Drug Use: No     Comment: denies  . Sexual Activity: Not on file   Other Topics Concern  . None   Social History Narrative   Additional History:    Sleep: Fair  Appetite:  Fair   Assessment:   Musculoskeletal: Strength & Muscle Tone: within normal limits Gait & Station: normal Patient leans: N/A   Psychiatric Specialty Exam: Physical Exam  Review of Systems  Constitutional: Negative.   HENT: Negative.   Eyes: Negative.   Respiratory: Negative.   Cardiovascular: Negative.   Gastrointestinal: Negative.   Genitourinary: Negative.   Musculoskeletal: Negative.   Skin: Negative.   Neurological: Negative.   Endo/Heme/Allergies: Negative.   Psychiatric/Behavioral: Positive for depression and substance abuse. The patient is nervous/anxious.     Blood pressure 148/76, pulse 74, temperature 97.5 F (36.4 C), temperature source Oral, resp. rate 16, height  (1.854 m), weight 70.761 kg (156 lb).Body mass index is 20.59 kg/(m^2).  General Appearance: Fairly Groomed  Patent attorney::  Fair  Speech:  Clear and Coherent  Volume:  Decreased  Mood:  Anxious and Depressed worried  Affect:  Depressed and worried  Thought Process:  Coherent and Goal Directed  Orientation:  Full (Time, Place, and Person)  Thought Content:  symptoms events worries concerns  Suicidal Thoughts:  No  Homicidal Thoughts:  No  Memory:  Immediate;   Fair Recent;   Fair Remote;  Fair  Judgement:  Fair  Insight:  Present  Psychomotor Activity:  Restlessness  Concentration:  Fair  Recall:  [employer] of Knowledge:Fair  Language: Fair  Akathisia:  No  Handed:  Right  AIMS (if indicated):     Assets:  Desire for Improvement Social Support  ADL's:  Intact  Cognition: WNL  Sleep:  Number of Hours: 6.25     Current Medications: Current Facility-Administered Medications  Medication Dose Route Frequency Provider Last Rate Last Dose  . acetaminophen (TYLENOL) tablet 650 mg  650 mg  Oral Q6H PRN Kerry Hough, PA-C      . alum & mag hydroxide-simeth (MAALOX/MYLANTA) 200-200-20 MG/5ML suspension 30 mL  30 mL Oral Q4H PRN Kerry Hough, PA-C      . doxepin (SINEQUAN) capsule 50 mg  50 mg Oral QHS,MR X 1 Kerry Hough, PA-C   50 mg at 05/19/14 2242  . escitalopram (LEXAPRO) tablet 5 mg  5 mg Oral Daily Rachael Fee, MD   5 mg at 05/20/14 0853  . hydrOXYzine (ATARAX/VISTARIL) tablet 25 mg  25 mg Oral Q6H PRN Rachael Fee, MD   25 mg at 05/20/14 1120  . loperamide (IMODIUM) capsule 2-4 mg  2-4 mg Oral PRN Kerry Hough, PA-C      . LORazepam (ATIVAN) tablet 1 mg  1 mg Oral Q6H PRN Kerry Hough, PA-C   1 mg at 05/20/14 1508  . LORazepam (ATIVAN) tablet 1 mg  1 mg Oral BID Rachael Fee, MD       Followed by  . [START ON 05/22/2014] LORazepam (ATIVAN) tablet 1 mg  1 mg Oral Daily Rachael Fee, MD      . magnesium hydroxide (MILK OF MAGNESIA) suspension 30 mL  30 mL Oral Daily PRN Kerry Hough, PA-C      . multivitamin with minerals tablet 1 tablet  1 tablet Oral Daily Kerry Hough, PA-C   1 tablet at 05/20/14 0853  . nicotine (NICODERM CQ - dosed in mg/24 hours) patch 21 mg  21 mg Transdermal Daily Kerry Hough, PA-C   21 mg at 05/20/14 0853  . ondansetron (ZOFRAN-ODT) disintegrating tablet 4 mg  4 mg Oral Q6H PRN Kerry Hough, PA-C      . thiamine (B-1) injection 100 mg  100 mg Intramuscular Once Kerry Hough, PA-C   100 mg at 05/17/14 2215  . thiamine (VITAMIN B-1) tablet 100 mg  100 mg Oral Daily Kerry Hough, PA-C   100 mg at 05/20/14 1610    Lab Results: No results found for this or any previous visit (from the past 48 hour(s)).  Physical Findings: AIMS: Facial and Oral Movements Muscles of Facial Expression: None, normal Lips and Perioral Area: None, normal Jaw: None, normal Tongue: None, normal,Extremity Movements Upper (arms, wrists, hands, fingers): None, normal Lower (legs, knees, ankles, toes): None, normal, Trunk Movements Neck,  shoulders, hips: None, normal, Overall Severity Severity of abnormal movements (highest score from questions above): None, normal Incapacitation due to abnormal movements: None, normal Patient's awareness of abnormal movements (rate only patient's report): No Awareness, Dental Status Current problems with teeth and/or dentures?: No Does patient usually wear dentures?: No  CIWA:  CIWA-Ar Total: 2 COWS:     Treatment Plan Summary: Daily contact with patient to assess and evaluate symptoms and progress in treatment and Medication management Alcohol dependence; pursue the detox further, work a relapse prevention plan Depression; will pursue  the Lexapro and increase it to 10 mg daily Social anxiety disorder: will continue to use CBT/mindfulness as well as the Lexapro and the Vistaril Will continue to explore residential treatment options  Medical Decision Making:  Review of Psycho-Social Stressors (1), Review of Medication Regimen & Side Effects (2) and Review of New Medication or Change in Dosage (2)     Darnita Woodrum A 05/20/2014, 3:27 PM

## 2014-05-20 NOTE — Progress Notes (Signed)
Adult Psychoeducational Group Note  Date:  05/20/2014 Time:  8:00pm  Group Topic/Focus:  Wrap-Up Group:   The focus of this group is to help patients review their daily goal of treatment and discuss progress on daily workbooks.  Participation Level:  Did Not Attend  Additional Comments: Pt. Didn't attend group with AA.  Bing PlumeScott, Chasty Randal D 05/20/2014, 10:13 PM

## 2014-05-20 NOTE — [facility] Group Notes (Signed)
[facility]   05/20/2014 10:01 AM  Participation Quality:  Appropriate   Mood/Affect:  Appropriate  Depression Rating:  7  Anxiety Rating:  9  Thoughts of Suicide:  No Will you contract for safety?   NA  Current AVH:  No  Plan for Discharge/Comments:  Pt reports that he is hoping to get into [facility]; plan B is [facility]. Pt reports his mother visited him last night and is supportive of his plan. Pt reports minimal withdrawal and anxiety controlled by Visteril. Pt reports better appetite today.   Transportation Means: unknown at this time.   Supports: mother/some family supports.   Smart, American FinancialHeather LCSWA

## 2014-05-20 NOTE — Progress Notes (Signed)
Pt did not attend group. 

## 2014-05-20 NOTE — Clinical Social Work Note (Signed)
[facility] and requested call back. CSW left message [facility] in attempt to complete screening for admission, as this is the pt's first choice.   [employer] Sherwin-WilliamsHeather Smart, LCSWA 05/20/2014 11:19 AM

## 2014-05-20 NOTE — Progress Notes (Signed)
Recreation Therapy Notes  Date: 04.08.2016 Time: 9:30am Location: [address] Group Room   Group Topic: Stress Management  Goal Area(s) Addresses:  Patient will actively participate in stress management techniques presented during session.   Behavioral Response: Did not attend.   Marykay Lexenise L Darriel Utter, LRT/CTRS  Jearl KlinefelterBlanchfield, Kellsey Sansone L 05/20/2014 5:54 PM

## 2014-05-21 DIAGNOSIS — F1020 Alcohol dependence, uncomplicated: Secondary | ICD-10-CM

## 2014-05-21 DIAGNOSIS — F419 Anxiety disorder, unspecified: Secondary | ICD-10-CM

## 2014-05-21 DIAGNOSIS — F4010 Social phobia, unspecified: Secondary | ICD-10-CM

## 2014-05-21 MED ORDER — LISINOPRIL 20 MG PO TABS
ORAL_TABLET | ORAL | Status: AC
  Administered 2014-05-21: 15:00:00
  Filled 2014-05-21: qty 1

## 2014-05-21 MED ORDER — GABAPENTIN 100 MG PO CAPS
200.0000 mg | ORAL_CAPSULE | Freq: Three times a day (TID) | ORAL | Status: DC
  Administered 2014-05-21 – 2014-05-23 (×7): 200 mg via ORAL
  Filled 2014-05-21 (×13): qty 2

## 2014-05-21 MED ORDER — LISINOPRIL 10 MG PO TABS
10.0000 mg | ORAL_TABLET | Freq: Every day | ORAL | Status: DC
  Administered 2014-05-22 – 2014-05-23 (×2): 10 mg via ORAL
  Filled 2014-05-21: qty 1
  Filled 2014-05-21: qty 2
  Filled 2014-05-21 (×3): qty 1

## 2014-05-21 MED ORDER — LISINOPRIL 20 MG PO TABS
20.0000 mg | ORAL_TABLET | Freq: Once | ORAL | Status: AC
  Administered 2014-05-21: 20 mg via ORAL

## 2014-05-21 MED ORDER — LORAZEPAM 1 MG PO TABS
1.0000 mg | ORAL_TABLET | Freq: Four times a day (QID) | ORAL | Status: AC | PRN
  Administered 2014-05-22: 1 mg via ORAL
  Filled 2014-05-21: qty 1

## 2014-05-21 MED ORDER — ESCITALOPRAM OXALATE 10 MG PO TABS
10.0000 mg | ORAL_TABLET | Freq: Every day | ORAL | Status: DC
  Administered 2014-05-22 – 2014-05-23 (×2): 10 mg via ORAL
  Filled 2014-05-21 (×5): qty 1

## 2014-05-21 NOTE — Plan of Care (Signed)
Problem: Diagnosis: Increased Risk For Suicide Attempt Goal: STG-Patient Will Comply With Medication Regime Outcome: Progressing Pt has been compliant with medications tonight.    Problem: Alteration in mood & ability to function due to Goal: LTG-Pt reports reduction in suicidal thoughts (Patient reports reduction in suicidal thoughts and is able to verbalize a safety plan for whenever patient is feeling suicidal)  Outcome: Progressing Pt denied SI this shift.  He verbally contracted for safety.

## 2014-05-21 NOTE — Progress Notes (Signed)
D: Pt has anxious affect and mood.  When asked about his goal, pt reported "I was anxious about what was going to happen when I was going to leave here.  The number 1 option I had said yes."  Pt reports he is "trying to keep my mind on track and not go back to the streets."  In regards to his aftercare plan, pt states "I feel good about it right now."  Pt reports he'll be going to [facility] for 28 days.  Pt reports he "might discharge Tuesday and I'll go there Wednesday."  Pt denies SI/HI, denies hallucinations.  Pt has been visible in the milieu.  He did not attend evening group.   A: Introduced self to pt and met with pt 1:1.  Actively listened to pt and provided support and encouragement.  Medications administered per order.  PRN medication administered for anxiety.   R: Pt is compliant with medications.  He verbally contracts for safety.  Will continue to monitor and assess.

## 2014-05-21 NOTE — Progress Notes (Signed)
Psychoeducational Group Note  Date: 05/21/2014 Time:  1015  Group Topic/Focus:  Identifying Needs:   The focus of this group is to help patients identify their personal needs that have been historically problematic and identify healthy behaviors to address their needs.  Participation Level:  Active  Participation Quality:  Appropriate  Affect:  Appropriate  Cognitive:  Oriented  Insight:  Improving  Engagement in Group:  Engaged  Additional Comments:  Pt attended this group and participated   Lilliane Sposito A 

## 2014-05-21 NOTE — Progress Notes (Signed)
D) Pt has attended the groups and participated in them. Will try to joke around but is able to focus down to the important things of his recovery. Pt [facility]. Pt expressed his relief in group. Denies SI and HI. Talking about self esteem and how important it is to help ourselves build our self esteem instead of tearing it down' A) Given support, reassurance and praise. Encouragement given along with individual attention. R) Looking and feeling more positive about his aftercare and being here.

## 2014-05-21 NOTE — Progress Notes (Signed)
.  Psychoeducational Group Note    Date: 05/21/2014 Time:  0930    Goal Setting Purpose of Group: To be able to set a goal that is measurable and that can be accomplished in one day Participation Level:  Active  Participation Quality:  Appropriate  Affect:  Appropriate  Cognitive:  Oriented  Insight:  Improving  Engagement in Group:  Engaged  Additional Comments:  Pt shared with the group his goal for the day.  Solei Wubben A 

## 2014-05-21 NOTE — [facility] Group Notes (Signed)
[facility] Group Notes:  (Clinical Social Work)  05/21/2014   1:15-2:15PM  Summary of Progress/Problems:   The main focus of today's process group was for the patient to identify ways in which they have sabotaged their own mental health wellness/recovery.  Motivational interviewing and a handout were used to explore the benefits and costs of their self-sabotaging behavior as well as the benefits and costs of changing this behavior.  The Stages of Change were explained to the group using a handout, and patients identified where they are with regard to changing self-defeating behaviors.  The patient expressed that one healthy coping skill he uses is laughter/humor.  He did not remain for the rest of group.  Type of Therapy:  Process Group  Participation Level:  Minimal  Participation Quality:  Attentive  Affect:  Blunted and Depressed  Cognitive:  Alert  Insight:  Developing/Improving and Limited  Engagement in Therapy:  Limited  Modes of Intervention:  Education, Motivational Interviewing   Timothy MantleMareida Grossman-Orr, LCSW 05/21/2014, 4:00pm

## 2014-05-21 NOTE — Progress Notes (Signed)
[facility] Group Notes:  (Nursing/MHT/Case Management/Adjunct)  Date:  05/21/2014  Time:  2100 Type of Therapy:  wrap up group  Participation Level:  Minimal  Participation Quality:  Appropriate, Inattentive, Sharing and Supportive  Affect:  Appropriate  Cognitive:  Appropriate  Insight:  Improving  Engagement in Group:  Distracting and Engaged  Modes of Intervention:  Clarification, Education and Support  Summary of Progress/Problems:  Pt reported having fun joking around with other patients today.  Pt was engaged in group but was distracting with side conversation with another patient at times. Pt shared that he has tried to get clean on his own many times and it never ever works, so he is finally ready for treatment. Pt said it has been offered to him before by thought he could stop on his own. Pt hopes to [facility] at discharge.   Shelah LewandowskySquires, Kamaile Zachow Carol 05/21/2014, 10:57 PM

## 2014-05-21 NOTE — Progress Notes (Signed)
Patient ID: Timothy Moses. Dorwart, male   DOB: 1985/11/09, 29 y.o.   MRN: [id number] [facility] MD Progress Note  05/21/2014 3:17 PM Tameron M. Speagle  MRN:  [id number]  Subjective:  Baron says he came to this hospital because he attempted to overdose. He adds that he was trying to take the easy way out for his impulsive behaviors. He says his anxiety is so high that he could not give it a number today. He rates his depression at #5. He remains on detoxification treatments. Gabapentin 200 mg tid added to his treatment regimen for agitation/substance withdrawal syndrome. He currently denies any SIHI. Lexapro increased to 10 mg for depression/anxiety symptoms.  Principal Problem: <principal problem not specified> Diagnosis:   Patient Active Problem List   Diagnosis Date Noted  . Alcohol dependence [F10.20] 05/18/2014  . ADHD (attention deficit hyperactivity disorder) [F90.9] 05/18/2014  . Social anxiety disorder [F40.10] 05/18/2014  . Polysubstance dependence [F19.20] 05/17/2014  . Overdose [T50.901A] 05/17/2014  . 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 Substance induced mood disorder [F19.94] 05/17/2014  . Drug ingestion [T50.901A]    Total Time spent with patient: 25 minutes  Past Medical History:  Past Medical History  Diagnosis Date  . ADHD (attention deficit hyperactivity disorder)   . Depression   . Anxiety    History reviewed. No pertinent past surgical history. Family History: History reviewed. No pertinent family history. Social History:  History  Alcohol Use  . Yes    Comment: weekends     History  Drug Use No    Comment: denies    History   Social History  . Marital Status: Significant Other    Spouse Name: N/A  . Number of Children: N/A  . Years of Education: N/A   Social History Main Topics  . Smoking status: Current Every Day Smoker  . Smokeless tobacco: Not on file  . Alcohol Use: Yes     Comment: weekends  . Drug Use: No     Comment: denies  .  Sexual Activity: Not on file   Other Topics Concern  . None   Social History Narrative   Additional History:    Sleep: Fair  Appetite:  Fair  Assessment: Objective:  Patient is seen and chart is reviewed. Patient continues to endorse ongoing anxiety, depressive symptoms & a lot of guilt for his alcoholism. He is rating his anxiety very high and  depression #5. However, he denies any suicidal/homicidal thoughts and or auditory/visual hallucinations. He is compliant with his current medications and has not verbalized any adverse reactions.  Musculoskeletal: Strength & Muscle Tone: within normal limits Gait & Station: normal Patient leans: N/A  Psychiatric Specialty Exam: Physical Exam  ROS  Blood pressure 154/86, pulse 99, temperature 97.8 F (36.6 C), temperature source Oral, resp. rate 20, height  (1.854 m), weight 70.761 kg (156 lb).Body mass index is 20.59 kg/(m^2).  General Appearance: Fairly Groomed  Patent attorney::  Fair  Speech:  Clear and Coherent  Volume:  Decreased  Mood:  Anxious and Depressed worried  Affect:  Depressed and worried  Thought Process:  Coherent and Goal Directed  Orientation:  Full (Time, Place, and Person)  Thought Content:  symptoms events worries concerns  Suicidal Thoughts:  No  Homicidal Thoughts:  No  Memory:  Immediate;   Fair Recent;   Fair Remote;   Fair  Judgement:  Fair  Insight:  Present  Psychomotor Activity:  Restlessness  Concentration:  Fair  Recall:  Fair  [employer] of Knowledge:Fair  Language: Fair  Akathisia:  No  Handed:  Right  AIMS (if indicated):     Assets:  Desire for Improvement Social Support  ADL's:  Intact  Cognition: WNL  Sleep:  Number of Hours: 5.5   Current Medications: Current Facility-Administered Medications  Medication Dose Route Frequency Provider Last Rate Last Dose  . acetaminophen (TYLENOL) tablet 650 mg  650 mg Oral Q6H PRN Kerry Hough, PA-C      . alum & mag hydroxide-simeth  (MAALOX/MYLANTA) 200-200-20 MG/5ML suspension 30 mL  30 mL Oral Q4H PRN Kerry Hough, PA-C      . doxepin (SINEQUAN) capsule 50 mg  50 mg Oral QHS,MR X 1 Kerry Hough, PA-C   50 mg at 05/20/14 2243  . escitalopram (LEXAPRO) tablet 5 mg  5 mg Oral Daily Rachael Fee, MD   5 mg at 05/21/14 0818  . gabapentin (NEURONTIN) capsule 200 mg  200 mg Oral TID Sanjuana Kava, NP      . hydrOXYzine (ATARAX/VISTARIL) tablet 25 mg  25 mg Oral Q6H PRN Rachael Fee, MD   25 mg at 05/21/14 1009  . [START ON 05/22/2014] lisinopril (PRINIVIL,ZESTRIL) tablet 10 mg  10 mg Oral Daily Sanjuana Kava, NP      . lisinopril (PRINIVIL,ZESTRIL) tablet 20 mg  20 mg Oral Once Sanjuana Kava, NP      . Melene Muller ON 05/22/2014] LORazepam (ATIVAN) tablet 1 mg  1 mg Oral Daily Rachael Fee, MD      . LORazepam (ATIVAN) tablet 1 mg  1 mg Oral Q6H PRN Sanjuana Kava, NP      . magnesium hydroxide (MILK OF MAGNESIA) suspension 30 mL  30 mL Oral Daily PRN Kerry Hough, PA-C      . multivitamin with minerals tablet 1 tablet  1 tablet Oral Daily Kerry Hough, PA-C   1 tablet at 05/21/14 0818  . nicotine (NICODERM CQ - dosed in mg/24 hours) patch 21 mg  21 mg Transdermal Daily Kerry Hough, PA-C   21 mg at 05/21/14 1008  . thiamine (B-1) injection 100 mg  100 mg Intramuscular Once Kerry Hough, PA-C   100 mg at 05/17/14 2215  . thiamine (VITAMIN B-1) tablet 100 mg  100 mg Oral Daily Kerry Hough, PA-C   100 mg at 05/21/14 1610   Lab Results: No results found for this or any previous visit (from the past 48 hour(s)).  Physical Findings: AIMS: Facial and Oral Movements Muscles of Facial Expression: None, normal Lips and Perioral Area: None, normal Jaw: None, normal Tongue: None, normal,Extremity Movements Upper (arms, wrists, hands, fingers): None, normal Lower (legs, knees, ankles, toes): None, normal, Trunk Movements Neck, shoulders, hips: None, normal, Overall Severity Severity of abnormal movements (highest score  from questions above): None, normal Incapacitation due to abnormal movements: None, normal Patient's awareness of abnormal movements (rate only patient's report): No Awareness, Dental Status Current problems with teeth and/or dentures?: No Does patient usually wear dentures?: No  CIWA:  CIWA-Ar Total: 1 COWS:     Treatment Plan Summary: Daily contact with patient to assess and evaluate symptoms and progress in treatment and Medication management Alcohol dependence; pursue the detox further, work a relapse prevention plan; initiate Lorazepam 10 mg Q 6 hours prn for detox.  Depression; will pursue the Lexapro and increase it to 10 mg daily Social anxiety disorder: will continue to use CBT/mindfulness as well  as the Lexapro, Vistaril 25 mg. Add Neurontin 200 mg tid for substance withdrawal syndrome. Will continue to explore residential treatment options  Medical Decision Making:  Review of Psycho-Social Stressors (1), Review of Medication Regimen & Side Effects (2) and Review of New Medication or Change in Dosage (2)  Sanjuana KavaNwoko, Agnes I, PMHNP, FNP-BC. 05/21/2014, 3:17 PM I agreed with findings and treatment plan of this patient

## 2014-05-22 NOTE — Progress Notes (Signed)
D: Pt has anxious affect and mood.  Pt reports his day has been "up and down."  Pt reports he "had a mood swing where I got down and anxious."  Pt reports his mother visited and his mother's friend visited.  He reported his mother's friend talked to him and made him feel better.  Pt reports he is "still happy and confident about my plan.  It's scary but I feel it's the best option for me."  Pt denies SI/HI, denies hallucinations.  Pt has been visible in the milieu and he attended evening group.   A: Met with pt 1:1 and provided support and encouragement.  Medications administered per order.  PRN medication administered for anxiety. R: Pt has been compliant with medication.  He verbally contracts for safety and reports that he will notify staff of needs and concerns.  Will continue to monitor and assess.

## 2014-05-22 NOTE — Progress Notes (Signed)
Psychoeducational Group Note  Date:  05/22/2014 Time:  1015  Group Topic/Focus:  Making Healthy Choices:   The focus of this group is to help patients identify negative/unhealthy choices they were using prior to admission and identify positive/healthier coping strategies to replace them upon discharge.  Participation Level:  Active  Participation Quality:  Appropriate  Affect:  Appropriate  Cognitive:  Oriented  Insight:  Improving  Engagement in Group:  Engaged  Additional Comments:  Pt engaged and participating in the group. Good input  Cortny Bambach A 05/22/2014 

## 2014-05-22 NOTE — [facility] Group Notes (Signed)
[facility] Group Notes:  (Clinical Social Work)  05/22/2014   1:15-2:15PM  Summary of Progress/Problems:  The main focus of today's process group was to   identify the patient's current support system and decide on other supports that can be put in place.  The picture on workbook was used to discuss why additional supports are needed.  An emphasis was placed on using counselor, doctor, therapy groups, 12-step groups, and problem-specific support groups to expand supports.   There was also an extensive discussion about what constitutes a healthy support versus an unhealthy support.  The patient expressed full comprehension of the concepts presented.  One current healthy support is his mother, in addition to family members and his hospital peers.  He stated that anyone with whom he drinks is an unhealthy support.  He had side conversations during group, got up and left for an anxiety medication at one point, and was eager for group to end so he could watch a basketball game.  Type of Therapy:  Process Group  Participation Level:  Active  Participation Quality:  Attentive and Sharing  Affect:  Anxious  Cognitive:  Appropriate and Oriented  Insight:  Limited  Engagement in Therapy:  Limited  Modes of Intervention:  Education,  Support and [employer]Processing  Nalla Purdy Grossman-Orr, LCSW 05/22/2014, 4:00pm

## 2014-05-22 NOTE — Plan of Care (Signed)
Problem: Diagnosis: Increased Risk For Suicide Attempt Goal: STG-Patient Will Attend All Groups On The Unit Outcome: Progressing Pt attended evening group on 05/21/14.  According to note, pt also attended and participated in groups during the day on 05/21/14.  Problem: Alteration in mood; excessive anxiety as evidenced by: Goal: STG-Pt will report an absence of self-harm thoughts/actions (Patient will report an absence of self-harm thoughts or actions)  Outcome: Progressing Pt denied thoughts of self-harm/SI this shift.  He verbally contracted for safety.

## 2014-05-22 NOTE — Progress Notes (Signed)
Patient did attend the evening speaker AA meeting.  

## 2014-05-22 NOTE — [facility] Group Notes (Signed)
[facility] Group Notes:  (Nursing/MHT/Case Management/Adjunct)  Date:  05/22/2014  Time:  10:35 AM  Type of Therapy:  Psychoeducational Skills  Participation Level:  Active  Participation Quality:  Appropriate  Affect:  Appropriate  Cognitive:  Appropriate  Insight:  Appropriate  Engagement in Group:  Engaged  Modes of Intervention:  Discussion  Summary of Progress/Problems: Pt did attend self inventory group, pt reported that he was negative SI/HI, no AH/VH noted. Pt rated his depression as a 7, and his helplessness/hopelessness as a 0.     Pt reported no issues or concerns.   Jacquelyne BalintForrest, Timothy Moses 05/22/2014, 10:35 AM

## 2014-05-22 NOTE — Progress Notes (Signed)
Patient ID: Timothy Moses. Timothy Moses, male   DOB: [birth year], 29 y.o.   MRN: [id number] Patient ID: Timothy Moses. Timothy Moses, male   DOB: 11-Dec-1985, 29 y.o.   MRN: [id number] [facility] MD Progress Note  05/22/2014 2:18 PM Timothy Moses  MRN:  [id number]  Subjective:  Timothy Moses says he is feeling a lot better today. Denies any anxiety & or symptoms of depression. He says he feeling almost ready to be discharged. Denies any SIHI.  Principal Problem: <principal problem not specified> Diagnosis:   Patient Active Problem List   Diagnosis Date Noted  . Alcohol dependence [F10.20] 05/18/2014  . ADHD (attention deficit hyperactivity disorder) [F90.9] 05/18/2014  . Social anxiety disorder [F40.10] 05/18/2014  . Polysubstance dependence [F19.20] 05/17/2014  . Overdose [T50.901A] 05/17/2014  . 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 Substance induced mood disorder [F19.94] 05/17/2014  . Drug ingestion [T50.901A]    Total Time spent with patient: 25 minutes  Past Medical History:  Past Medical History  Diagnosis Date  . ADHD (attention deficit hyperactivity disorder)   . Depression   . Anxiety    History reviewed. No pertinent past surgical history. Family History: History reviewed. No pertinent family history. Social History:  History  Alcohol Use  . Yes    Comment: weekends     History  Drug Use No    Comment: denies    History   Social History  . Marital Status: Significant Other    Spouse Name: N/A  . Number of Children: N/A  . Years of Education: N/A   Social History Main Topics  . Smoking status: Current Every Day Smoker  . Smokeless tobacco: Not on file  . Alcohol Use: Yes     Comment: weekends  . Drug Use: No     Comment: denies  . Sexual Activity: Not on file   Other Topics Concern  . None   Social History Narrative   Additional History:    Sleep: Fair  Appetite:  Fair  Assessment:  Timothy Moses says he is doing well today. He visible on the unit, participating in  group sessions today. He currently denies any suicidal/homicidal thoughts and or auditory/visual hallucinations. He is compliant with his current medications and has not verbalized any adverse reactions. He appears in no apparent distress.  Musculoskeletal: Strength & Muscle Tone: within normal limits Gait & Station: normal Patient leans: N/A  Psychiatric Specialty Exam: Physical Exam  ROS  Blood pressure 135/71, pulse 72, temperature 97.9 F (36.6 C), temperature source Oral, resp. rate 16, height  (1.854 m), weight 70.761 kg (156 lb).Body mass index is 20.59 kg/(m^2).  General Appearance: Fairly Groomed  Patent attorney::  Fair  Speech:  Clear and Coherent  Volume:  Normal  Mood:  Improving  Affect:  Appropriate  Thought Process:  Coherent and Goal Directed  Orientation:  Full (Time, Place, and Person)  Thought Content:  Within normal  Suicidal Thoughts:  No  Homicidal Thoughts:  No  Memory:  Immediate;   Fair Recent;   Fair Remote;   Fair  Judgement:  Fair  Insight:  Present  Psychomotor Activity:  Normal  Concentration:  Fair  Recall:  [employer] of Knowledge:Fair  Language: Fair  Akathisia:  No  Handed:  Right  AIMS (if indicated):     Assets:  Desire for Improvement Social Support  ADL's:  Intact  Cognition: WNL  Sleep:  Number of Hours: 4.75   Current Medications: Current Facility-Administered Medications  Medication Dose  Route Frequency Provider Last Rate Last Dose  . acetaminophen (TYLENOL) tablet 650 mg  650 mg Oral Q6H PRN Kerry HoughSpencer E Simon, PA-C      . alum & mag hydroxide-simeth (MAALOX/MYLANTA) 200-200-20 MG/5ML suspension 30 mL  30 mL Oral Q4H PRN Kerry HoughSpencer E Simon, PA-C      . doxepin (SINEQUAN) capsule 50 mg  50 mg Oral QHS,MR X 1 Kerry HoughSpencer E Simon, PA-C   50 mg at 05/21/14 2213  . escitalopram (LEXAPRO) tablet 10 mg  10 mg Oral Daily Sanjuana KavaAgnes I Nwoko, NP   10 mg at 05/22/14 [id number]  . gabapentin (NEURONTIN) capsule 200 mg  200 mg Oral TID Sanjuana KavaAgnes I Nwoko, NP    200 mg at 05/22/14 1213  . hydrOXYzine (ATARAX/VISTARIL) tablet 25 mg  25 mg Oral Q6H PRN Rachael FeeIrving A Lugo, MD   25 mg at 05/22/14 1213  . lisinopril (PRINIVIL,ZESTRIL) tablet 10 mg  10 mg Oral Daily Sanjuana KavaAgnes I Nwoko, NP   10 mg at 05/22/14 [id number]  . magnesium hydroxide (MILK OF MAGNESIA) suspension 30 mL  30 mL Oral Daily PRN Kerry HoughSpencer E Simon, PA-C      . multivitamin with minerals tablet 1 tablet  1 tablet Oral Daily Kerry HoughSpencer E Simon, PA-C   1 tablet at 05/22/14 [id number]  . nicotine (NICODERM CQ - dosed in mg/24 hours) patch 21 mg  21 mg Transdermal Daily Kerry HoughSpencer E Simon, PA-C   21 mg at 05/22/14 0735  . thiamine (B-1) injection 100 mg  100 mg Intramuscular Once Kerry HoughSpencer E Simon, PA-C   100 mg at 05/17/14 2215  . thiamine (VITAMIN B-1) tablet 100 mg  100 mg Oral Daily Kerry HoughSpencer E Simon, PA-C   100 mg at 05/22/14 [id number]   Lab Results: No results found for this or any previous visit (from the past 48 hour(s)).  Physical Findings: AIMS: Facial and Oral Movements Muscles of Facial Expression: None, normal Lips and Perioral Area: None, normal Jaw: None, normal Tongue: None, normal,Extremity Movements Upper (arms, wrists, hands, fingers): None, normal Lower (legs, knees, ankles, toes): None, normal, Trunk Movements Neck, shoulders, hips: None, normal, Overall Severity Severity of abnormal movements (highest score from questions above): None, normal Incapacitation due to abnormal movements: None, normal Patient's awareness of abnormal movements (rate only patient's report): No Awareness, Dental Status Current problems with teeth and/or dentures?: No Does patient usually wear dentures?: No  CIWA:  CIWA-Ar Total: 3 COWS:     Treatment Plan Summary: Daily contact with patient to assess and evaluate symptoms and progress in treatment and Medication management  Alcohol dependence; completed detox treatment, work a relapse prevention plan; Depression; continue Lexapro 10 mg daily, Doxepin 10 mg for  anxiety/insomnia. Social anxiety disorder: will continue to use CBT/mindfulness as well as the Lexapro, Vistaril 25 mg. Add Neurontin 200 mg tid for substance withdrawal syndrome. Will continue to explore residential treatment options  Medical Decision Making:  Review of Psycho-Social Stressors (1), Review of Medication Regimen & Side Effects (2) and Review of New Medication or Change in Dosage (2)  Sanjuana KavaNwoko, Agnes I, PMHNP, FNP-BC. 05/22/2014, 2:18 PM I agreed with findings and treatment plan of this patient

## 2014-05-22 NOTE — Progress Notes (Signed)
Psychoeducational Group Note  Date: 05/22/2014 Time:0930 Group Topic/Focus:  Gratefulness:  The focus of this group is to help patients identify what two things they are most grateful for in their lives. What helps ground them and to center them on their work to their recovery.  Participation Level:  Active  Participation Quality:  Appropriate  Affect:  Appropriate  Cognitive:  Oriented  Insight:  Improving  Engagement in Group:  Engaged  Additional Comments:  Pt participated in the group and shared thoughts and feelings.  Taishawn Smaldone A  

## 2014-05-22 NOTE — Progress Notes (Signed)
Patient ID: Timothy Moses, male   DOB: [birth year], 29 y.o.   MRN: [id number]   D: Pt has been appropriate on the unit, he has attended all groups and engaged in treatment. Pt has took all medication without any problems, no other issues or concerns noted. Pt reported that his depression was a 6, his hopelessness was a 3, and that his anxiety was a 8. Pt reported that his goal for today was to get over depression and anxiety. Pt reported being negative SI/HI, no AH/VH noted. A: 15 min checks continued for patient safety. R: Pt safety maintained.

## 2014-05-23 DIAGNOSIS — F902 Attention-deficit hyperactivity disorder, combined type: Secondary | ICD-10-CM | POA: Insufficient documentation

## 2014-05-23 DIAGNOSIS — F10230 Alcohol dependence with withdrawal, uncomplicated: Secondary | ICD-10-CM | POA: Insufficient documentation

## 2014-05-23 MED ORDER — ESCITALOPRAM OXALATE 10 MG PO TABS: 10.0000 mg | ORAL_TABLET | Freq: Every day | ORAL | Status: DC

## 2014-05-23 MED ORDER — GABAPENTIN 100 MG PO CAPS: 200.0000 mg | ORAL_CAPSULE | Freq: Three times a day (TID) | ORAL | Status: DC

## 2014-05-23 MED ORDER — DOXEPIN HCL 50 MG PO CAPS: ORAL_CAPSULE | ORAL | Status: DC

## 2014-05-23 MED ORDER — LISINOPRIL 10 MG PO TABS: 10.0000 mg | ORAL_TABLET | Freq: Every day | ORAL | Status: DC

## 2014-05-23 MED ORDER — HYDROXYZINE HCL 25 MG PO TABS: 25.0000 mg | ORAL_TABLET | Freq: Four times a day (QID) | ORAL | Status: DC | PRN

## 2014-05-23 NOTE — Progress Notes (Signed)
Discharge Note:  Patient discharged home.  Denied SI and HI.  Denied A/V hallucinations.  Suicide prevention information given and discussed with patient who stated he understood and had no questions.  Patient stated he received all his belongings, clothing, toiletries, misc items, prescriptions, medications, tennis shoes.  [facility]

## 2014-05-23 NOTE — BHH Suicide Risk Assessment (Signed)
[facility] Discharge Suicide Risk Assessment   Demographic Factors:  Male and Caucasian  Total Time spent with patient: 30 minutes  Musculoskeletal: Strength & Muscle Tone: within normal limits Gait & Station: normal Patient leans: N/A  Psychiatric Specialty Exam: Physical Exam  Review of Systems  Constitutional: Negative.   HENT: Negative.   Eyes: Negative.   Respiratory: Negative.   Cardiovascular: Negative.   Gastrointestinal: Negative.   Genitourinary: Negative.   Musculoskeletal: Negative.   Skin: Negative.   Neurological: Negative.   Endo/Heme/Allergies: Negative.   Psychiatric/Behavioral: Positive for substance abuse. The patient is nervous/anxious.     Blood pressure 130/80, pulse 87, temperature 97.5 F (36.4 C), temperature source Oral, resp. rate 16, height  (1.854 m), weight 70.761 kg (156 lb).Body mass index is 20.59 kg/(m^2).  General Appearance: Fairly Groomed  Patent attorney::  Fair  Speech:  Clear and Coherent409  Volume:  Normal  Mood:  Euthymic  Affect:  Appropriate  Thought Process:  Coherent and Goal Directed  Orientation:  Full (Time, Place, and Person)  Thought Content:  plans as he mvoes on, relapse prevention plan  Suicidal Thoughts:  No  Homicidal Thoughts:  No  Memory:  Immediate;   Fair Recent;   Fair Remote;   Fair  Judgement:  Fair  Insight:  Present  Psychomotor Activity:  Normal  Concentration:  Fair  Recall:  [employer] of Knowledge:Fair  Language: Fair  Akathisia:  No  Handed:  Right  AIMS (if indicated):     Assets:  Desire for Improvement Housing Social Support Talents/Skills  Sleep:  Number of Hours: 6  Cognition: WNL  ADL's:  Intact   Have you used any form of tobacco in the last 30 days? (Cigarettes, Smokeless Tobacco, Cigars, and/or Pipes): Yes  Has this patient used any form of tobacco in the last 30 days? (Cigarettes, Smokeless Tobacco, Cigars, and/or Pipes) Yes, A prescription for an FDA-approved tobacco cessation  medication was offered at discharge and the patient refused  Mental Status Per Nursing Assessment::   On Admission:  NA  Current Mental Status by Physician: IN full contact with reality. There are no active S/S of withdrawal. There are no active SI plans or intent. States he is ready to move on. He will be admitted to [facility] Wednesday AM. He is wanting to be D/C today. Will stay with his mother. States there is no alcohol at the house. He is committed to abstinence. His brother who he did not have communication with called him. States he is getting all this support. He states he has been told he looks healthier he sounds healthier. He will also continue to address his anxiety and depression    Loss Factors: Legal issues  Historical Factors: Impulsivity  Risk Reduction Factors:   Sense of responsibility to family and Positive social support  Continued Clinical Symptoms:  Severe Anxiety and/or Agitation Depression:   Comorbid alcohol abuse/dependence Alcohol/Substance Abuse/Dependencies  Cognitive Features That Contribute To Risk:  Closed-mindedness, Polarized thinking and Thought constriction (tunnel vision)    Suicide Risk:  Minimal: No identifiable suicidal ideation.  Patients presenting with no risk factors but with morbid ruminations; may be classified as minimal risk based on the severity of the depressive symptoms  Principal Problem: <principal problem not specified> Discharge Diagnoses:  Patient Active Problem List   Diagnosis Date Noted  . Alcohol dependence [F10.20] 05/18/2014  . ADHD (attention deficit hyperactivity disorder) [F90.9] 05/18/2014  . Social anxiety disorder [F40.10] 05/18/2014  . Polysubstance  dependence [F19.20] 05/17/2014  . Overdose [T50.901A] 05/17/2014  . 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 Substance induced mood disorder [F19.94] 05/17/2014  . Drug ingestion [T50.901A]     Follow-up Information    [facility]  On [facility]/2014.   Why:  Admission on this date at 8AM per [facility]. Please bring photo ID with you.    Contact information:   [address] [place], [place] [address] Phone: [phone] Fax: [phone]      Plan Of Care/Follow-up recommendations:  Activity:  as tolerated Diet:  regular Follow [facility] then further outpatient follow up Is patient on multiple antipsychotic therapies at discharge:  No   Has Patient had three or more failed trials of antipsychotic monotherapy by history:  No  Recommended Plan for Multiple Antipsychotic Therapies: NA    Ronav Furney A 05/23/2014, 2:43 PM

## 2014-05-23 NOTE — Progress Notes (Signed)
D: Pt has anxious affect and mood.  He reports "I had a good day today."  Pt denies SI/HI, denies hallucinations, denies pain.  Pt has been visible in milieu interacting with peers and staff appropriately.  Pt attended evening group.   A: Met with pt 1:1 and provided support and encouragement.  Medications administered per order.  PRN medication administered for sleep. R: Pt is compliant with medications.  Pt verbally contracts for safety.  Will continue to monitor and assess.

## 2014-05-23 NOTE — Progress Notes (Addendum)
  [facility] Discharge Plan :  Will you be returning to the same living situation after discharge:  Yes,  home with mom until [facility]  At discharge, do you have transportation home?: Yes,  mother coming after lunch today Do you have the ability to pay for your medications: Yes,  mental health  Release of information consent forms completed and submitted to Medical Records by CSW.  Patient to Follow up at: Follow-up Information    [facility]  On 05/25/2014.   Why:  Admission on this date at 8AM per [facility]. Please bring photo ID with you.    Contact information:   [address] [place], [place] [address] Phone: [phone] Fax: [phone]      Patient denies SI/HI: Yes,  during group/self report.     Safety Planning and Suicide Prevention discussed: Yes,  Contact attempts made with family friend. Pt asked that CSW do not call his mother regarding SPE. SPE completed with pt and he was given SPI pamphlet and mobile crisis info to share with his support network.  Have you used any form of tobacco in the last 30 days? (Cigarettes, Smokeless Tobacco, Cigars, and/or Pipes): Yes  Has patient been referred to the Quitline?: Yes, faxed on 05/23/14  Smart, Theon Sobotka LCSWA  05/23/2014, 1:12 PM

## 2014-05-23 NOTE — BHH Suicide Risk Assessment (Addendum)
B[facility] INPATIENT:  Family/Significant Other Suicide Prevention Education  Suicide Prevention Education:  Contact Attempts: Renea Humberto SealsSaxon (pt's friend) [phone] has been identified by the patient as the family member/significant other with whom the patient will be residing, and identified as the person(s) who will aid the patient in the event of a mental health crisis.  With written consent from the patient, two attempts were made to provide suicide prevention education, prior to and/or following the patient's discharge.  We were unsuccessful in providing suicide prevention education.  A suicide education pamphlet was given to the patient to share with family/significant other.   Date and time of first attempt: 05/23/14 9:45AM (voicemail left requesting call back at earliest convenience)  Date and time of second attempt: 05/23/14 1:00PM (voicemail left requesting call back at earliest convenience)  Smart, RomeovilleHeather LCSWA 05/23/2014, 1:05 PM   SPE completed with pt's friend, Renea Saxon. She verbalized understanding of all information and she voiced no concerns regarding SI but is concerned about him relapsing prior to [facility]. CSW shared that pt and MD decided on d/c date of today and transferred call for pt to speak with her directly about her concerns.   The Sherwin-WilliamsHeather Smart, LCSWA 05/23/2014 2:25 PM

## 2014-05-23 NOTE — Plan of Care (Signed)
Problem: Alteration in mood & ability to function due to Goal: STG-Patient will attend groups Outcome: Progressing Pt attended evening group on 05/23/14.     

## 2014-05-23 NOTE — Progress Notes (Addendum)
D:  Patient's self inventory sheet, patient sleeps good, sleep medication is helpful.  Good appetite, normal energy level, good concentration.  Rated depression 4, denied hopeless, anxiety 8.  Denied withdrawals.  Stated he has felt irritable.  Denied SI.  Denied physical problems.  Denied pain.  Goal is to work on anxiety.  Plans to breathe and talk to pass.  Feels he is ready for discharge.  No discharge plans.  No problems anticipated after discharge. A:  Medications administered per MD orders.  Emotional support and encouragement given patient. R:  Denied SI and HI, contracts for safety.  Denied A/V hallucinations.  Safety maintained with 15 minute checks.

## 2014-05-23 NOTE — Plan of Care (Signed)
Problem: Consults Goal: Suicide Risk Patient Education (See Patient Education module for education specifics)  Outcome: Completed/Met Date Met:  05/23/14 Nurse discussed suicidal thoughts/ coping skills with patient.     

## 2014-05-23 NOTE — Discharge Summary (Signed)
Physician Discharge Summary Note  Patient:  Timothy Moses is an 29 y.o., male MRN:  [id number] DOB:  1986/01/07 Patient phone:  [phone] (home)  Patient address:   [address] [place] [address],  Total Time spent with patient: Greater than 30 minutes  Date of Admission:  05/17/2014  Date of Discharge: 05/23/14  Reason for Admission:   Principal Problem: <principal problem not specified> Discharge Diagnoses: Patient Active Problem List   Diagnosis Date Noted  . Alcohol dependence [F10.20] 05/18/2014  . ADHD (attention deficit hyperactivity disorder) [F90.9] 05/18/2014  . Social anxiety disorder [F40.10] 05/18/2014  . Polysubstance dependence [F19.20] 05/17/2014  . Overdose [T50.901A] 05/17/2014  . 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 Substance induced mood disorder [F19.94] 05/17/2014  . Drug ingestion [T50.901A]    Musculoskeletal: Strength & Muscle Tone: within normal limits Gait & Station: normal Patient leans: N/A  Psychiatric Specialty Exam: Physical Exam  Psychiatric: His speech is normal and behavior is normal. Judgment and thought content normal. His mood appears not anxious. His affect is not angry, not blunt, not labile and not inappropriate. Cognition and memory are normal. He does not exhibit a depressed mood.    Review of Systems  Constitutional: Negative.   HENT: Negative.   Eyes: Negative.   Respiratory: Negative.   Cardiovascular: Negative.   Gastrointestinal: Negative.   Genitourinary: Negative.   Musculoskeletal: Negative.   Skin: Negative.   Neurological: Negative.   Endo/Heme/Allergies: Negative.   Psychiatric/Behavioral: Positive for depression (Stable) and substance abuse (Alcoholism, chronic). Negative for suicidal ideas, hallucinations and memory loss. The patient has insomnia (Stable). The patient is not nervous/anxious.     Blood pressure 130/80, pulse 87, temperature 97.5 F (36.4 C), temperature source  Oral, resp. rate 16, height  (1.854 m), weight 70.761 kg (156 lb).Body mass index is 20.59 kg/(m^2).  See Md's suicide risks assessment   Past Medical History:  Past Medical History  Diagnosis Date  . ADHD (attention deficit hyperactivity disorder)   . Depression   . Anxiety    History reviewed. No pertinent past surgical history. Family History: History reviewed. No pertinent family history. Social History:  History  Alcohol Use  . Yes    Comment: weekends     History  Drug Use No    Comment: denies    History   Social History  . Marital Status: Significant Other    Spouse Name: N/A  . Number of Children: N/A  . Years of Education: N/A   Social History Main Topics  . Smoking status: Current Every Day Smoker  . Smokeless tobacco: Not on file  . Alcohol Use: Yes     Comment: weekends  . Drug Use: No     Comment: denies  . Sexual Activity: Not on file   Other Topics Concern  . None   Social History Narrative   Risk to Self: Is patient at risk for suicide?: No What has been your use of drugs/alcohol within the last 12 months?: alcohol-about a 12 pk a day for past few years. "I binge drink on weekend and black out alot." abusing adderral for several months-"I take about  a day." marijuana and cocaine-infrequently. few times per month-recent use.   Risk to Others: No  Prior Inpatient Therapy: No  Prior Outpatient Therapy: No  Level of Care:  OP  Hospital Course:  29 Y/o male who admits he took 10 Trazodone 150 mg. States he does not know why he took the  OD. States he was drinking too, states he now does not know if he was trying to kill himself as he had some left in the bottle and if he was trying to kill himself he would probably had taken the whole bottle. He states [place] going [place] in [place] them money could not register for classes, came back home, staid with his GF, she broke up with him. He then moved with a friend he was told not to  get too drunk, he did and he kicked him out. States he dropped all his stuff at his mother's.  Timothy Moses was admitted to the hospital for worsening symptoms of depression, suspected suicide attempts by overdose on Trazodone & alcohol/drug intoxications. His blood alcohol level upon admission was 222 per toxicology tests reports & UDS test results positive for Amphetamine, Benzodiazepine, Cocaine & THC. He was intoxicated requiring detoxification as well as mood stabilization treatments. His detoxification treatments was achieved using Ativan detox regimen on a tapering dose format. By using ativan detox regimen, Timothy Moses received a cleaner detox treatment without the lingering adverse effects of the long acting Librium capsules had it been that Librium capsules were used for this detox treatment.  Besides the detox treatment, Timothy Moses also was medicated and discharged on Neurontin 100 mg three times daily for substance withdrawal syndrome/agitation, Hydroxyzine 25 mg prn for anxiety, Lexapro 10 mg for depression & Doxepin 50 mg for depression/anxiety/insomnia. He also received other medication management for the other medical issues that he presented. He tolerated his treatment regimen without any significant adverse effects and or reactions. Timothy Moses participated in the AA/NA meetings and group counseling sessions being offered and held on this unit. He learned coping skills.  Timothy Moses has completed detox treatment & his mood is stabilized. He is currently being discharged to the of home his mother & will continue treatment [facility]. He has been given all the necessary information needed to make this appointment without problems. Upon discharge, he adamantly denies any SIHI, AVH, delusional thoughts, paranoia and or substance withdrawal symptoms. He received some samples of [facility]. [facility] in no distress.  Transportation per mother.   Consults:  psychiatry  Consults:  psychiatry  Significant Diagnostic Studies:  labs: CBC with diff, CMP, UDS, toxicology tests, U/A, results reviewed, stable  Discharge Vitals:   Blood pressure 130/80, pulse 87, temperature 97.5 F (36.4 C), temperature source Oral, resp. rate 16, height 6\' 1"  (1.854 m), weight 70.761 kg (156 lb). Body mass index is 20.59 kg/(m^2). Lab Results:   No results found for this or any previous visit (from the past 72 hour(s)).  Physical Findings: AIMS: Facial and Oral Movements Muscles of Facial Expression: None, normal Lips and Perioral Area: None, normal Jaw: None, normal Tongue: None, normal,Extremity Movements Upper (arms, wrists, hands, fingers): None, normal Lower (legs, knees, ankles, toes): None, normal, Trunk Movements Neck, shoulders, hips: None, normal, Overall Severity Severity of abnormal movements (highest score from questions above): None, normal Incapacitation due to abnormal movements: None, normal Patient's awareness of abnormal movements (rate only patient's report): No Awareness, Dental Status Current problems with teeth and/or dentures?: No Does patient usually wear dentures?: No  CIWA:  CIWA-Ar Total: 1 COWS:  COWS Total Score: 2  See Psychiatric Specialty Exam and Suicide Risk Assessment completed by Attending Physician prior to discharge.  Discharge destination:  Other:  Home, [facility]  Is patient on multiple antipsychotic therapies at discharge:  No    Has Patient had three or more failed trials of antipsychotic monotherapy by history:  No  Recommended Plan for Multiple Antipsychotic Therapies: NA    Medication List    STOP taking these medications        amphetamine-dextroamphetamine 20 MG tablet  Commonly known as:  ADDERALL     traZODone 150 MG tablet  Commonly known as:  DESYREL      TAKE these medications      Indication   doxepin 50 MG capsule   Commonly known as:  SINEQUAN  Take 1 tablet (50 mg) Q bedtime: For depression/anxiety/sleep   Indication:  Depression, Anxiety, Sleep     escitalopram 10 MG tablet  Commonly known as:  LEXAPRO  Take 1 tablet (10 mg total) by mouth daily. For depression   Indication:  Depression     gabapentin 100 MG capsule  Commonly known as:  NEURONTIN  Take 2 capsules (200 mg total) by mouth 3 (three) times daily. For agitation/substannce withdrawal syndrome   Indication:  Agitation, Alcohol Withdrawal Syndrome     hydrOXYzine 25 MG tablet  Commonly known as:  ATARAX/VISTARIL  Take 1 tablet (25 mg total) by mouth every 6 (six) hours as needed for anxiety.   Indication:  Anxiety     lisinopril 10 MG tablet  Commonly known as:  PRINIVIL,ZESTRIL  Take 1 tablet (10 mg total) by mouth daily. For high blood pressure   Indication:  High Blood Pressure       Follow-up Information    Follow up with [facility]  On 05/25/2014.   Why:  Admission on this date at 8AM per [facility]. Please bring photo ID with you.    Contact information:   [address] [place], [place] [address] Phone: [phone] Fax: [phone]     Follow-up recommendations: Activity:  As tolerated Diet: As recommended by your primary care doctor. Keep all scheduled follow-up appointments as recommended.   Comments:  Take all your medications as prescribed by your mental healthcare provider. Report any adverse effects and or reactions from your medicines to your outpatient provider promptly. Patient is instructed and cautioned to not engage in alcohol and or illegal drug use while on prescription medicines. In the event of worsening symptoms, patient is instructed to call the crisis hotline, 911 and or go to the nearest ED for appropriate evaluation and treatment of symptoms. Follow-up with your primary care provider for your other medical issues, concerns and or health care needs.   Total Discharge Time: Greater than  30 minutes  Signed: Sanjuana Kava, PMHNP, FNP-BC 05/23/2014, 1:41 PM  I personally assessed the patient and formulated the plan Madie Reno A. Dub Mikes, M.D.

## 2014-05-23 NOTE — Tx Team (Addendum)
Interdisciplinary Treatment Plan Update (Adult)   Date: 05/23/2014   Time Reviewed: 11:14 AM  Progress in Treatment:  Attending groups: Yes  Participating in groups:  Yes  Taking medication as prescribed: Yes  Tolerating medication: Yes  Family/Significant othe contact made: Not yet. SPE required for this pt.   Patient understands diagnosis: Yes, AEB seeking treatment for anxiety, depression, recent overdose (pt states unintentional), and for medication stabilization.  Discussing patient identified problems/goals with staff: Yes  Medical problems stabilized or resolved: Yes  Denies suicidal/homicidal ideation: Yes during self report.  Patient has not harmed self or Others: Yes  New problem(s) identified:  Discharge Plan or Barriers: [facility] Pt to return home with his mother on Tuesday to pack for inpatient stay. ADS info provided as well for outpatient services after treatment.  Additional comments: Timothy M. Manson PasseyBrown is an 29 y.o. male [facility] 8 of the 150 mg trazadone. Per RN note, Pt was brought in by his friend. Pt reports taking the trazadone at midnight last night and his only intention was to get some sleep. Pt denies any SI and reports no prior attempts of hurting himself. Pt reports that he feels anxious everyday and has panic attacks rarely, the last one was in December of 2015. Pt reports a history of depression from March of 2015 to August of 2015. [facility] that time. Pt denies any current depression. Pt also denies SI, HI, any abuse, access to weapons, history of violence, and any family history of mental health issues. Pt reports using majiuana 6 to 8 times a month for a few years. Pt reports getting 20 mgs of adderall from his friend everyday. Pt reports drinking 24 oz of beer everyday and has a pending DUI with a court date on September 21, 2014. Pt reports going to [facility] for treatment in  2012 for 3 years and currently goes to [employer] meetings. 05/23/14: Pt has been actively attending and participating in groups and in the milieu, he reports no withdrawals. High anxiety "but it's manageable and nothing new." Pt reports minimal depressive symptoms, good sleep, and reports that his meds are working well to help him stabilize mood. Pt is pleasant and cooperative.  Reason for Continuation of Hospitalization: none  Estimated length of stay: d/c today  For review of initial/current patient goals, please see plan of care.  Attendees:  Patient:    Family:    Physician: Dr. Dub MikesLugo MD 05/23/2014 11:14 AM   Nursing: Harlow OhmsVivian, Ronecia, Beverly RN 05/23/2014 11:14 AM   Clinical Social Worker Nayali Talerico Smart, LCSWA  05/23/2014 11:14 AM   Other: Trilby LeaverKristin D. LCSWA; Quylle H. LCSW 05/23/2014 11:14 AM   Other: Darden DatesJennifer C. Nurse CM 05/23/2014 11:14 AM   Other: Liliane Badeolora Sutton, Community Care Coordinator  05/23/2014 11:14 AM   Other:    Scribe for Treatment Team:  Herbert SetaHeather Smart LCSWA 05/23/2014 11:14 AM

## 2014-05-23 NOTE — [facility] Group Notes (Signed)
[facility] Dis[facility] Planning [facility] Note   05/23/2014 11:12 AM  Participation Quality:  Appropriate   Mood/Affect:  Appropriate  Depression Rating:  4  Anxiety Rating:  8  Thoughts of Suicide:  No Will you contract for safety?   NA  Current AVH:  No  Plan for Discharge/Comments:   Pt reports that he is hoping to d/c Tuesday in order to pack and [facility] Pt reports that he is not experiencing withdrawals and is looking forward to inpatient treatment. Pt reports good sleep and feels that his medications are helping with mood stabilization.   Transportation Means: mother   Supports: mother/some family supports.   Smart, American FinancialHeather LCSWA

## 2014-05-24 ENCOUNTER — Encounter (HOSPITAL_COMMUNITY): Payer: Self-pay | Admitting: Emergency Medicine

## 2014-05-26 NOTE — Progress Notes (Signed)
Patient Discharge Instructions:  After Visit Summary (AVS):   Faxed to:  05/26/14 Discharge Summary Note:   Faxed to:  05/26/14 Psychiatric Admission Assessment Note:   Faxed to:  05/26/14 Suicide Risk Assessment - Discharge Assessment:   Faxed to:  05/26/14 Faxed/Sent to the Next Level Care provider:  05/26/14 [facility] to [facility] @ [phone]  Jerelene ReddenSheena E [place], 05/26/2014, 3:28 PM

## 2014-09-08 ENCOUNTER — Ambulatory Visit: Payer: Self-pay

## 2014-09-19 ENCOUNTER — Ambulatory Visit: Payer: Self-pay

## 2014-09-20 ENCOUNTER — Ambulatory Visit: Payer: Self-pay

## 2014-09-21 ENCOUNTER — Encounter (INDEPENDENT_AMBULATORY_CARE_PROVIDER_SITE_OTHER): Payer: Self-pay

## 2014-10-31 ENCOUNTER — Encounter: Payer: Self-pay | Admitting: Emergency Medicine

## 2014-10-31 ENCOUNTER — Emergency Department
Admission: EM | Admit: 2014-10-31 | Discharge: 2014-10-31 | Disposition: A | Discharge status: Home / Self Care | Payer: Self-pay | Attending: Student | Admitting: Student

## 2014-10-31 DIAGNOSIS — Z79899 Other long term (current) drug therapy: Secondary | ICD-10-CM | POA: Insufficient documentation

## 2014-10-31 DIAGNOSIS — M5416 Radiculopathy, lumbar region: Secondary | ICD-10-CM | POA: Insufficient documentation

## 2014-10-31 DIAGNOSIS — F909 Attention-deficit hyperactivity disorder, unspecified type: Secondary | ICD-10-CM | POA: Insufficient documentation

## 2014-10-31 DIAGNOSIS — Z720 Tobacco use: Secondary | ICD-10-CM | POA: Insufficient documentation

## 2014-10-31 DIAGNOSIS — F329 Major depressive disorder, single episode, unspecified: Secondary | ICD-10-CM | POA: Insufficient documentation

## 2014-10-31 DIAGNOSIS — F419 Anxiety disorder, unspecified: Secondary | ICD-10-CM | POA: Insufficient documentation

## 2014-10-31 DIAGNOSIS — M5410 Radiculopathy, site unspecified: Secondary | ICD-10-CM

## 2014-10-31 MED ORDER — METHYLPREDNISOLONE SODIUM SUCC 125 MG IJ SOLR
125.0000 mg | Freq: Once | INTRAMUSCULAR | Status: AC
  Administered 2014-10-31: 125 mg via INTRAMUSCULAR
  Filled 2014-10-31: qty 2

## 2014-10-31 MED ORDER — METHYLPREDNISOLONE 4 MG PO TBPK: ORAL_TABLET | ORAL | Status: DC

## 2014-10-31 NOTE — ED Provider Notes (Signed)
[facility] Emergency Department Provider Note ____________________________________________  Time seen: Approximately 12:34 PM  I have reviewed the triage vital signs and the nursing notes.   HISTORY  Chief Complaint Leg Pain   HPI Timothy Moses is a 29 y.o. male complaining of some radicular back pain. Patient said he had a fractured back in December 2015. Patient state he started having radicular problems in April 2016. Patient denies any bladder or bowel dysfunction. Patient state he is in a recovery house and cannot take any narcotic medications. Patient is rating his pain discomfort as 8/10. States no palliative measures taken for this complaint.   Past Medical History  Diagnosis Date  . ADHD (attention deficit hyperactivity disorder)   . Depression   . Anxiety     Patient Active Problem List   Diagnosis Date Noted  . Attention deficit hyperactivity disorder (ADHD), combined type   . Alcohol dependence with uncomplicated withdrawal   . Alcohol dependence 05/18/2014  . ADHD (attention deficit hyperactivity disorder) 05/18/2014  . Social anxiety disorder 05/18/2014  . Polysubstance dependence 05/17/2014  . Overdose 05/17/2014  . Depression, major, severe recurrence 05/17/2014  . Substance induced mood disorder 05/17/2014  . Drug ingestion     History reviewed. No pertinent past surgical history.  Current Outpatient Rx  Name  Route  Sig  Dispense  Refill  . doxepin (SINEQUAN) 50 MG capsule      Take 1 tablet (50 mg) Q bedtime: For depression/anxiety/sleep   30 capsule   0   . escitalopram (LEXAPRO) 10 MG tablet   Oral   Take 1 tablet (10 mg total) by mouth daily. For depression   30 tablet   0   . gabapentin (NEURONTIN) 100 MG capsule   Oral   Take 2 capsules (200 mg total) by mouth 3 (three) times daily. For agitation/substannce withdrawal syndrome   90 capsule   0   . hydrOXYzine (ATARAX/VISTARIL) 25 MG tablet   Oral   Take 1  tablet (25 mg total) by mouth every 6 (six) hours as needed for anxiety.   60 tablet   0   . lisinopril (PRINIVIL,ZESTRIL) 10 MG tablet   Oral   Take 1 tablet (10 mg total) by mouth daily. For high blood pressure   30 tablet   0   . methocarbamol (ROBAXIN-750) 750 MG tablet   Oral   Take 1 tablet (750 mg total) by mouth every 8 (eight) hours as needed for muscle spasms.   40 tablet   0   . oxyCODONE-acetaminophen (PERCOCET/ROXICET) 5-325 MG per tablet   Oral   Take 2 tablets by mouth every 4 (four) hours as needed for severe pain.   20 tablet   0     Allergies Review of patient's allergies indicates no known allergies.  No family history on file.  Social History Social History  Substance Use Topics  . Smoking status: Current Every Day Smoker  . Smokeless tobacco: None  . Alcohol Use: No     Comment: 5 months sober    Review of Systems Constitutional: No fever/chills Eyes: No visual changes. ENT: No sore throat. Cardiovascular: Denies chest pain. Respiratory: Denies shortness of breath. Gastrointestinal: No abdominal pain.  No nausea, no vomiting.  No diarrhea.  No constipation. Genitourinary: Negative for dysuria. Musculoskeletal: Left back pain Skin: Negative for rash. Neurological: Negative for headaches, focal weakness or numbness. Psychiatric:ADHD, depression, and anxiety.   ____________________________________________   PHYSICAL EXAM:  VITAL SIGNS:  ED Triage Vitals  Enc Vitals Group     BP 10/31/14 1226 156/72 mmHg     Pulse Rate 10/31/14 1226 77     Resp 10/31/14 1226 16     Temp 10/31/14 1226 98.2 F (36.8 C)     Temp Source 10/31/14 1226 Oral     SpO2 10/31/14 1226 97 %     Weight 10/31/14 1226 180 lb (81.647 kg)     Height 10/31/14 1226 6' (1.829 m)     Head Cir --      Peak Flow --      Pain Score 10/31/14 1226 8     Pain Loc --      Pain Edu? --      Excl. in GC? --     Constitutional: Alert and oriented. Well appearing and in  no acute distress. Eyes: Conjunctivae are normal. PERRL. EOMI. Head: Atraumatic. Nose: No congestion/rhinnorhea. Mouth/Throat: Mucous membranes are moist.  Oropharynx non-erythematous. Neck: No stridor.   Hematological/Lymphatic/Immunilogical: No cervical lymphadenopathy. Cardiovascular: Normal rate, regular rhythm. Grossly normal heart sounds.  Good peripheral circulation. Respiratory: Normal respiratory effort.  No retractions. Lungs CTAB. Gastrointestinal: Soft and nontender. No distention. No abdominal bruits. No CVA tenderness. Musculoskeletal: No spinal deformity. No guarding palpation spinal processes. Patient had full nuchal range of motion. Patient had negative straight leg test bilaterally. Neurologic:  Normal speech and language. No gross focal neurologic deficits are appreciated. No gait instability. Skin:  Skin is warm, dry and intact. No rash noted. Psychiatric: Mood and affect are normal. Speech and behavior are normal.  ____________________________________________   LABS (all labs ordered are listed, but only abnormal results are displayed)  Labs Reviewed - No data to display ____________________________________________  EKG   ____________________________________________  RADIOLOGY   ____________________________________________   PROCEDURES  Procedure(s) performed: None  Critical Care performed: No  ____________________________________________   INITIAL IMPRESSION / ASSESSMENT AND PLAN / ED COURSE  Pertinent labs & imaging results that were available during my care of the patient were reviewed by me and considered in my medical decision making (see chart for details).  Radicular low back pain to the left lower extremity. Patient given Solu-Medrol in the ER and a prescription for prednisone. Patient advised to follow with the pain management clinic for definitive diagnosis and treatment. ____________________________________________   FINAL CLINICAL  IMPRESSION(S) / ED DIAGNOSES  Final diagnoses:  Radicular low back pain      Joni Reining, PA-C 10/31/14 1252  Gayla Doss, MD 10/31/14 1635

## 2014-10-31 NOTE — ED Notes (Signed)
Lower back pain with radiates into left leg  Hx of sciatica in past.

## 2014-10-31 NOTE — ED Notes (Signed)
Pt here from [facility] with c/o left leg pain, reports "sciatica" for a few months, worse the past few days. Pt is recovering from alcohol and drugs, benzos and adderall.

## 2014-11-03 ENCOUNTER — Encounter: Payer: Self-pay | Admitting: *Deleted

## 2014-11-03 DIAGNOSIS — Z720 Tobacco use: Secondary | ICD-10-CM | POA: Insufficient documentation

## 2014-11-03 DIAGNOSIS — Z79899 Other long term (current) drug therapy: Secondary | ICD-10-CM | POA: Insufficient documentation

## 2014-11-03 DIAGNOSIS — M5432 Sciatica, left side: Secondary | ICD-10-CM | POA: Insufficient documentation

## 2014-11-03 DIAGNOSIS — M549 Dorsalgia, unspecified: Secondary | ICD-10-CM | POA: Insufficient documentation

## 2014-11-03 DIAGNOSIS — G8929 Other chronic pain: Secondary | ICD-10-CM | POA: Insufficient documentation

## 2014-11-03 NOTE — ED Notes (Signed)
Pt reports sciatica left side x 4 months. Seen 2 days ago, but now pain increased again.

## 2014-11-04 ENCOUNTER — Emergency Department
Admission: EM | Admit: 2014-11-04 | Discharge: 2014-11-04 | Disposition: A | Discharge status: Home / Self Care | Payer: Self-pay | Attending: Emergency Medicine | Admitting: Emergency Medicine

## 2014-11-04 DIAGNOSIS — M549 Dorsalgia, unspecified: Secondary | ICD-10-CM

## 2014-11-04 DIAGNOSIS — M5432 Sciatica, left side: Secondary | ICD-10-CM

## 2014-11-04 DIAGNOSIS — G8929 Other chronic pain: Secondary | ICD-10-CM

## 2014-11-04 MED ORDER — PREDNISONE 20 MG PO TABS: 20.0000 mg | ORAL_TABLET | Freq: Every day | ORAL | Status: DC

## 2014-11-04 MED ORDER — DEXAMETHASONE SODIUM PHOSPHATE 10 MG/ML IJ SOLN
10.0000 mg | Freq: Once | INTRAMUSCULAR | Status: AC
  Administered 2014-11-04: 10 mg via INTRAMUSCULAR
  Filled 2014-11-04: qty 1

## 2014-11-04 MED ORDER — DIAZEPAM 5 MG PO TABS: 5.0000 mg | ORAL_TABLET | Freq: Three times a day (TID) | ORAL | Status: DC | PRN

## 2014-11-04 NOTE — Discharge Instructions (Signed)
You were prescribed a medication that is potentially sedating. Do not drink alcohol, drive or participate in any other potentially dangerous activities while taking this medication as it may make you sleepy. Do not take this medication with any other sedating medications, either prescription or over-the-counter. If you were prescribed Percocet or Vicodin, do not take these with acetaminophen (Tylenol) as it is already contained within these medications.   Opioid pain medications (or "narcotics") can be habit forming.  Use it as little as possible to achieve adequate pain control.  Do not use or use it with extreme caution if you have a history of opiate abuse or dependence.  If you are on a pain contract with your primary care doctor or a pain specialist, be sure to let them know you were prescribed this medication today from the [facility] Emergency Department.  This medication is intended for your use only - do not give any to anyone else and keep it in a secure place where nobody else, especially children and pets, have access to it.  It will also cause or worsen constipation, so you may want to consider taking an over-the-counter stool softener while you are taking this medication.  Back Exercises Back exercises help treat and prevent back injuries. The goal of back exercises is to increase the strength of your abdominal and back muscles and the flexibility of your back. These exercises should be started when you no longer have back pain. Back exercises include:  Pelvic Tilt. Lie on your back with your knees bent. Tilt your pelvis until the lower part of your back is against the floor. Hold this position 5 to 10 sec and repeat 5 to 10 times.  Knee to Chest. Pull first 1 knee up against your chest and hold for 20 to 30 seconds, repeat this with the other knee, and then both knees. This may be done with the other leg straight or bent, whichever feels better.  Sit-Ups or Curl-Ups. Bend your  knees 90 degrees. Start with tilting your pelvis, and do a partial, slow sit-up, lifting your trunk only 30 to 45 degrees off the floor. Take at least 2 to 3 seconds for each sit-up. Do not do sit-ups with your knees out straight. If partial sit-ups are difficult, simply do the above but with only tightening your abdominal muscles and holding it as directed.  Hip-Lift. Lie on your back with your knees flexed 90 degrees. Push down with your feet and shoulders as you raise your hips a couple inches off the floor; hold for 10 seconds, repeat 5 to 10 times.  Back arches. Lie on your stomach, propping yourself up on bent elbows. Slowly press on your hands, causing an arch in your low back. Repeat 3 to 5 times. Any initial stiffness and discomfort should lessen with repetition over time.  Shoulder-Lifts. Lie face down with arms beside your body. Keep hips and torso pressed to floor as you slowly lift your head and shoulders off the floor. Do not overdo your exercises, especially in the beginning. Exercises may cause you some mild back discomfort which lasts for a few minutes; however, if the pain is more severe, or lasts for more than 15 minutes, do not continue exercises until you see your caregiver. Improvement with exercise therapy for back problems is slow.  See your caregivers for assistance with developing a proper back exercise program. Document Released: 03/07/2004 Document Revised: 04/22/2011 Document Reviewed: 11/29/2010 [facility] Patient Information 2015 [place], [place]. This information  is not intended to replace advice given to you by your health care provider. Make sure you discuss any questions you have with your health care provider.  Chronic Back Pain  When back pain lasts longer than 3 months, it is called chronic back pain.People with chronic back pain often go through certain periods that are more intense (flare-ups).  CAUSES Chronic back pain can be caused by wear and tear  (degeneration) on different structures in your back. These structures include:  The bones of your spine (vertebrae) and the joints surrounding your spinal cord and nerve roots (facets).  The strong, fibrous tissues that connect your vertebrae (ligaments). Degeneration of these structures may result in pressure on your nerves. This can lead to constant pain. HOME CARE INSTRUCTIONS  Avoid bending, heavy lifting, prolonged sitting, and activities which make the problem worse.  Take brief periods of rest throughout the day to reduce your pain. Lying down or standing usually is better than sitting while you are resting.  Take over-the-counter or prescription medicines only as directed by your caregiver. SEEK IMMEDIATE MEDICAL CARE IF:   You have weakness or numbness in one of your legs or feet.  You have trouble controlling your bladder or bowels.  You have nausea, vomiting, abdominal pain, shortness of breath, or fainting. Document Released: 03/07/2004 Document Revised: 04/22/2011 Document Reviewed: 01/12/2011 [facility] Patient Information 2015 [place], [place]. This information is not intended to replace advice given to you by your health care provider. Make sure you discuss any questions you have with your health care provider.  Sciatica Sciatica is pain, weakness, numbness, or tingling along the path of the sciatic nerve. The nerve starts in the lower back and runs down the back of each leg. The nerve controls the muscles in the lower leg and in the back of the knee, while also providing sensation to the back of the thigh, lower leg, and the sole of your foot. Sciatica is a symptom of another medical condition. For instance, nerve damage or certain conditions, such as a herniated disk or bone spur on the spine, pinch or put pressure on the sciatic nerve. This causes the pain, weakness, or other sensations normally associated with sciatica. Generally, sciatica only affects one side of the  body. CAUSES   Herniated or slipped disc.  Degenerative disk disease.  A pain disorder involving the narrow muscle in the buttocks (piriformis syndrome).  Pelvic injury or fracture.  Pregnancy.  Tumor (rare). SYMPTOMS  Symptoms can vary from mild to very severe. The symptoms usually travel from the low back to the buttocks and down the back of the leg. Symptoms can include:  Mild tingling or dull aches in the lower back, leg, or hip.  Numbness in the back of the calf or sole of the foot.  Burning sensations in the lower back, leg, or hip.  Sharp pains in the lower back, leg, or hip.  Leg weakness.  Severe back pain inhibiting movement. These symptoms may get worse with coughing, sneezing, laughing, or prolonged sitting or standing. Also, being overweight may worsen symptoms. DIAGNOSIS  Your caregiver will perform a physical exam to look for common symptoms of sciatica. He or she may ask you to do certain movements or activities that would trigger sciatic nerve pain. Other tests may be performed to find the cause of the sciatica. These may include:  Blood tests.  X-rays.  Imaging tests, such as an MRI or CT scan. TREATMENT  Treatment is directed at the cause  of the sciatic pain. Sometimes, treatment is not necessary and the pain and discomfort goes away on its own. If treatment is needed, your caregiver may suggest:  Over-the-counter medicines to relieve pain.  Prescription medicines, such as anti-inflammatory medicine, muscle relaxants, or narcotics.  Applying heat or ice to the painful area.  Steroid injections to lessen pain, irritation, and inflammation around the nerve.  Reducing activity during periods of pain.  Exercising and stretching to strengthen your abdomen and improve flexibility of your spine. Your caregiver may suggest losing weight if the extra weight makes the back pain worse.  Physical therapy.  Surgery to eliminate what is pressing or pinching  the nerve, such as a bone spur or part of a herniated disk. HOME CARE INSTRUCTIONS   Only take over-the-counter or prescription medicines for pain or discomfort as directed by your caregiver.  Apply ice to the affected area for 20 minutes, 3-4 times a day for the first 48-72 hours. Then try heat in the same way.  Exercise, stretch, or perform your usual activities if these do not aggravate your pain.  Attend physical therapy sessions as directed by your caregiver.  Keep all follow-up appointments as directed by your caregiver.  Do not wear high heels or shoes that do not provide proper support.  Check your mattress to see if it is too soft. A firm mattress may lessen your pain and discomfort. SEEK IMMEDIATE MEDICAL CARE IF:   You lose control of your bowel or bladder (incontinence).  You have increasing weakness in the lower back, pelvis, buttocks, or legs.  You have redness or swelling of your back.  You have a burning sensation when you urinate.  You have pain that gets worse when you lie down or awakens you at night.  Your pain is worse than you have experienced in the past.  Your pain is lasting longer than 4 weeks.  You are suddenly losing weight without reason. MAKE SURE YOU:  Understand these instructions.  Will watch your condition.  Will get help right away if you are not doing well or get worse. Document Released: 01/22/2001 Document Revised: 07/30/2011 Document Reviewed: 06/09/2011 [facility] Patient Information 2015 [place], [place]. This information is not intended to replace advice given to you by your health care provider. Make sure you discuss any questions you have with your health care provider.

## 2014-11-04 NOTE — ED Provider Notes (Signed)
[facility] Emergency Department Provider Note  ____________________________________________  Time seen: 2:30 AM  I have reviewed the triage vital signs and the nursing notes.   HISTORY  Chief Complaint Back Pain    HPI Timothy Moses is a 29 y.o. male who complains of chronic left sciatica for many months. No recent trauma. It's worse over the last 2 days. No numbness tingling or weakness. No bowel or bladder retention or incontinence. No fevers or chills.Requests steroids as this has worked well for the pain in the past but has not seen a doctor for this.     Past Medical History  Diagnosis Date  . ADHD (attention deficit hyperactivity disorder)   . Depression   . Anxiety      Patient Active Problem List   Diagnosis Date Noted  . Attention deficit hyperactivity disorder (ADHD), combined type   . Alcohol dependence with uncomplicated withdrawal   . Alcohol dependence 05/18/2014  . ADHD (attention deficit hyperactivity disorder) 05/18/2014  . Social anxiety disorder 05/18/2014  . Polysubstance dependence 05/17/2014  . Overdose 05/17/2014  . Depression, major, severe recurrence 05/17/2014  . Substance induced mood disorder 05/17/2014  . Drug ingestion      History reviewed. No pertinent past surgical history.   Current Outpatient Rx  Name  Route  Sig  Dispense  Refill  . diazepam (VALIUM) 5 MG tablet   Oral   Take 1 tablet (5 mg total) by mouth every 8 (eight) hours as needed for muscle spasms.   4 tablet   0   . doxepin (SINEQUAN) 50 MG capsule      Take 1 tablet (50 mg) Q bedtime: For depression/anxiety/sleep   30 capsule   0   . escitalopram (LEXAPRO) 10 MG tablet   Oral   Take 1 tablet (10 mg total) by mouth daily. For depression   30 tablet   0   . gabapentin (NEURONTIN) 100 MG capsule   Oral   Take 2 capsules (200 mg total) by mouth 3 (three) times daily. For agitation/substannce withdrawal syndrome   90 capsule   0    . hydrOXYzine (ATARAX/VISTARIL) 25 MG tablet   Oral   Take 1 tablet (25 mg total) by mouth every 6 (six) hours as needed for anxiety.   60 tablet   0   . lisinopril (PRINIVIL,ZESTRIL) 10 MG tablet   Oral   Take 1 tablet (10 mg total) by mouth daily. For high blood pressure   30 tablet   0   . methocarbamol (ROBAXIN-750) 750 MG tablet   Oral   Take 1 tablet (750 mg total) by mouth every 8 (eight) hours as needed for muscle spasms.   40 tablet   0   . methylPREDNISolone (MEDROL DOSEPAK) 4 MG TBPK tablet      Take Tapered dose as directed   21 tablet   0   . oxyCODONE-acetaminophen (PERCOCET/ROXICET) 5-325 MG per tablet   Oral   Take 2 tablets by mouth every 4 (four) hours as needed for severe pain.   20 tablet   0   . predniSONE (DELTASONE) 20 MG tablet   Oral   Take 1 tablet (20 mg total) by mouth daily.   8 tablet   0      Allergies Review of patient's allergies indicates no known allergies.   No family history on file.  Social History Social History  Substance Use Topics  . Smoking status: Current Every Day Smoker  .  Smokeless tobacco: None  . Alcohol Use: No     Comment: 5 months sober    Review of Systems  Constitutional:   No fever or chills. No weight changes Eyes:   No blurry vision or double vision.  ENT:   No sore throat. Cardiovascular:   No chest pain. Respiratory:   No dyspnea or cough. Gastrointestinal:   Negative for abdominal pain, vomiting and diarrhea.  No BRBPR or melena. Genitourinary:   Negative for dysuria, urinary retention, bloody urine, or difficulty urinating. Musculoskeletal:   Chronic back pain Skin:   Negative for rash. Neurological:   Negative for headaches, focal weakness or numbness. Psychiatric:  No anxiety or depression.   Endocrine:  No hot/cold intolerance, changes in energy, or sleep difficulty.  10-point ROS otherwise negative.  ____________________________________________   PHYSICAL EXAM:  VITAL  SIGNS: ED Triage Vitals  Enc Vitals Group     BP 11/03/14 2309 146/87 mmHg     Pulse Rate 11/03/14 2309 88     Resp 11/03/14 2309 16     Temp 11/03/14 2309 97.7 F (36.5 C)     Temp Source 11/03/14 2309 Oral     SpO2 11/03/14 2309 95 %     Weight 11/03/14 2309 180 lb (81.647 kg)     Height 11/03/14 2309 6' (1.829 m)     Head Cir --      Peak Flow --      Pain Score 11/03/14 2308 10     Pain Loc --      Pain Edu? --      Excl. in GC? --      Constitutional:   Alert and oriented. Well appearing and in no distress. Eyes:   No scleral icterus. No conjunctival pallor. PERRL. EOMI ENT   Head:   Normocephalic and atraumatic.   Nose:   No congestion/rhinnorhea. No septal hematoma   Mouth/Throat:   MMM, no pharyngeal erythema. No peritonsillar mass. No uvula shift.   Neck:   No stridor. No SubQ emphysema. No meningismus. Hematological/Lymphatic/Immunilogical:   No cervical lymphadenopathy. Cardiovascular:   RRR. Normal and symmetric distal pulses are present in all extremities. No murmurs, rubs, or gallops. Respiratory:   Normal respiratory effort without tachypnea nor retractions. Breath sounds are clear and equal bilaterally. No wheezes/rales/rhonchi. Gastrointestinal:   Soft and nontender. No distention. There is no CVA tenderness.  No rebound, rigidity, or guarding. Genitourinary:   deferred Musculoskeletal:   Nontender with normal range of motion in all extremities. No joint effusions.  No lower extremity tenderness.  No edema. No midline spinal tenderness. Positive straight leg raise on the left hip 20 reproducing sciatica. No muscle spasm or tenderness. Neurologic:   Normal speech and language.  CN 2-10 normal. Motor grossly intact. No pronator drift.  Normal gait. No gross focal neurologic deficits are appreciated.  Skin:    Skin is warm, dry and intact. No rash noted.  No petechiae, purpura, or bullae. Psychiatric:   Mood and affect are normal. Speech and  behavior are normal. Patient exhibits appropriate insight and judgment.  ____________________________________________    LABS (pertinent positives/negatives) (all labs ordered are listed, but only abnormal results are displayed) Labs Reviewed - No data to display ____________________________________________   EKG    ____________________________________________    RADIOLOGY    ____________________________________________   PROCEDURES   ____________________________________________   INITIAL IMPRESSION / ASSESSMENT AND PLAN / ED COURSE  Pertinent labs & imaging results that were available during my care  of the patient were reviewed by me and considered in my medical decision making (see chart for details).  Patient presents with sciatica and chronic back pain. No red flags or other acute findings. Low suspicion for fractures or infection. Give the patient steroids have him continue taking NSAIDs and give him a short course of low-dose Valium. Given follow-up information for orthopedics so he can try to obtain some definitive management. Also given information on rehabilitation back exercises.     ____________________________________________   FINAL CLINICAL IMPRESSION(S) / ED DIAGNOSES  Final diagnoses:  Chronic back pain  Sciatica, left      Sharman Cheek, MD 11/04/14 [phone]

## 2014-11-08 ENCOUNTER — Ambulatory Visit: Payer: Self-pay

## 2014-11-08 DIAGNOSIS — I10 Essential (primary) hypertension: Secondary | ICD-10-CM | POA: Insufficient documentation

## 2014-11-23 ENCOUNTER — Emergency Department
Admission: EM | Admit: 2014-11-23 | Discharge: 2014-11-23 | Disposition: A | Discharge status: Home / Self Care | Payer: Self-pay | Attending: Emergency Medicine | Admitting: Emergency Medicine

## 2014-11-23 ENCOUNTER — Encounter: Payer: Self-pay | Admitting: Emergency Medicine

## 2014-11-23 DIAGNOSIS — Z720 Tobacco use: Secondary | ICD-10-CM | POA: Insufficient documentation

## 2014-11-23 DIAGNOSIS — Z79899 Other long term (current) drug therapy: Secondary | ICD-10-CM | POA: Insufficient documentation

## 2014-11-23 DIAGNOSIS — M5432 Sciatica, left side: Secondary | ICD-10-CM | POA: Insufficient documentation

## 2014-11-23 MED ORDER — LIDOCAINE 5 % EX PTCH: 1.0000 | MEDICATED_PATCH | Freq: Two times a day (BID) | CUTANEOUS | Status: AC

## 2014-11-23 MED ORDER — IBUPROFEN 200 MG PO TABS: 600.0000 mg | ORAL_TABLET | Freq: Four times a day (QID) | ORAL | Status: DC | PRN

## 2014-11-23 MED ORDER — KETOROLAC TROMETHAMINE 60 MG/2ML IM SOLN
60.0000 mg | Freq: Once | INTRAMUSCULAR | Status: AC
  Administered 2014-11-23: 60 mg via INTRAMUSCULAR
  Filled 2014-11-23: qty 2

## 2014-11-23 MED ORDER — CYCLOBENZAPRINE HCL 10 MG PO TABS: 10.0000 mg | ORAL_TABLET | Freq: Three times a day (TID) | ORAL | Status: DC | PRN

## 2014-11-23 MED ORDER — LIDOCAINE 5 % EX PTCH
1.0000 | MEDICATED_PATCH | Freq: Every day | CUTANEOUS | Status: DC
  Administered 2014-11-23: 1 via TRANSDERMAL
  Filled 2014-11-23 (×3): qty 1

## 2014-11-23 NOTE — ED Provider Notes (Signed)
[facility] Emergency Department Provider Note  ____________________________________________  Time seen: Approximately 625 AM  I have reviewed the triage vital signs and the nursing notes.   HISTORY  Chief Complaint Leg Pain and Sciatica    HPI Meril MAUREEN DUESING is a 29 y.o. male who comes in with sciatic nerve pain. The patient reports that the pain is going down his left leg. The patient reports that he had mild twinges since May but has been worse in the last 2 weeks. The patient was seen here recently and was given steroids which helped decrease the pain. The patient reports that he has not followed up although he is on Neurontin. The patient reports that he thinks he might of gone back to work too soon and that's why the pain has returned. The patient is currently at [facility] for treatment and his currently been clean from drugs and alcohol for the last 6 months. The patient according to the nurse had [facility] and was given Valium but since benzodiazepines was one of his drugs of abuse he has not used it. The patient reports that his pain is 8 out of 10 in intensity. He reports that the Neurontin has not been helping his pain.   Past Medical History  Diagnosis Date  . ADHD (attention deficit hyperactivity disorder)   . Depression   . Anxiety     Patient Active Problem List   Diagnosis Date Noted  . Attention deficit hyperactivity disorder (ADHD), combined type   . Alcohol dependence with uncomplicated withdrawal (HCC)   . Alcohol dependence (HCC) 05/18/2014  . ADHD (attention deficit hyperactivity disorder) 05/18/2014  . Social anxiety disorder 05/18/2014  . Polysubstance dependence (HCC) 05/17/2014  . Overdose 05/17/2014  . Depression, major, severe recurrence (HCC) 05/17/2014  . Substance induced mood disorder (HCC) 05/17/2014  . Drug ingestion     History reviewed. No pertinent past surgical history.  Current Outpatient Rx  Name   Route  Sig  Dispense  Refill  . escitalopram (LEXAPRO) 10 MG tablet   Oral   Take 1 tablet (10 mg total) by mouth daily. For depression   30 tablet   0   . gabapentin (NEURONTIN) 600 MG tablet   Oral   Take 600 mg by mouth 3 (three) times daily.         . traZODone (DESYREL) 50 MG tablet   Oral   Take 50 mg by mouth at bedtime as needed for sleep.         . cyclobenzaprine (FLEXERIL) 10 MG tablet   Oral   Take 1 tablet (10 mg total) by mouth every 8 (eight) hours as needed for muscle spasms.   15 tablet   0   . diazepam (VALIUM) 5 MG tablet   Oral   Take 1 tablet (5 mg total) by mouth every 8 (eight) hours as needed for muscle spasms. Patient not taking: Reported on 11/23/2014   4 tablet   0   . doxepin (SINEQUAN) 50 MG capsule      Take 1 tablet (50 mg) Q bedtime: For depression/anxiety/sleep Patient not taking: Reported on 11/23/2014   30 capsule   0   . gabapentin (NEURONTIN) 100 MG capsule   Oral   Take 2 capsules (200 mg total) by mouth 3 (three) times daily. For agitation/substannce withdrawal syndrome Patient not taking: Reported on 11/23/2014   90 capsule   0   . hydrOXYzine (ATARAX/VISTARIL) 25 MG  tablet   Oral   Take 1 tablet (25 mg total) by mouth every 6 (six) hours as needed for anxiety. Patient not taking: Reported on 11/23/2014   60 tablet   0   . ibuprofen (MOTRIN IB) 200 MG tablet   Oral   Take 3 tablets (600 mg total) by mouth every 6 (six) hours as needed.   20 tablet   0   . lidocaine (LIDODERM) 5 %   Transdermal   Place 1 patch onto the skin every 12 (twelve) hours. Remove & Discard patch within 12 hours or as directed by MD   5 patch   0   . lisinopril (PRINIVIL,ZESTRIL) 10 MG tablet   Oral   Take 1 tablet (10 mg total) by mouth daily. For high blood pressure Patient not taking: Reported on 11/23/2014   30 tablet   0   . methocarbamol (ROBAXIN-750) 750 MG tablet   Oral   Take 1 tablet (750 mg total) by mouth every 8  (eight) hours as needed for muscle spasms.   40 tablet   0   . methylPREDNISolone (MEDROL DOSEPAK) 4 MG TBPK tablet      Take Tapered dose as directed   21 tablet   0   . oxyCODONE-acetaminophen (PERCOCET/ROXICET) 5-325 MG per tablet   Oral   Take 2 tablets by mouth every 4 (four) hours as needed for severe pain.   20 tablet   0   . predniSONE (DELTASONE) 20 MG tablet   Oral   Take 1 tablet (20 mg total) by mouth daily. Patient not taking: Reported on 11/23/2014   8 tablet   0     Allergies Review of patient's allergies indicates no known allergies.  No family history on file.  Social History Social History  Substance Use Topics  . Smoking status: Current Every Day Smoker -- 0.50 packs/day    Types: Cigarettes  . Smokeless tobacco: Current User  . Alcohol Use: No     Comment: 5 months sober    Review of Systems Constitutional: No fever/chills Eyes: No visual changes. ENT: No sore throat. Cardiovascular: Denies chest pain. Respiratory: Denies shortness of breath. Gastrointestinal: No abdominal pain.  No nausea, no vomiting.  No diarrhea.  No constipation. Genitourinary: Negative for dysuria. Musculoskeletal:  back pain with radiation down left leg  Skin: Negative for rash. Neurological: Negative for headaches, focal weakness or numbness.  10-point ROS otherwise negative.  ____________________________________________   PHYSICAL EXAM:  VITAL SIGNS: ED Triage Vitals  Enc Vitals Group     BP 11/23/14 0545 144/71 mmHg     Pulse Rate 11/23/14 0545 83     Resp 11/23/14 0545 16     Temp 11/23/14 0545 97.6 F (36.4 C)     Temp Source 11/23/14 0545 Oral     SpO2 11/23/14 0545 98 %     Weight 11/23/14 0545 180 lb (81.647 kg)     Height 11/23/14 0545 6\' 1"  (1.854 m)     Head Cir --      Peak Flow --      Pain Score 11/23/14 0558 8     Pain Loc --      Pain Edu? --      Excl. in GC? --     Constitutional: Alert and oriented. Well appearing and in mild  to moderate distress. Eyes: Conjunctivae are normal. PERRL. EOMI. Head: Atraumatic. Nose: No congestion/rhinnorhea. Mouth/Throat: Mucous membranes are moist.  Oropharynx non-erythematous. Cardiovascular: Normal  rate, regular rhythm. Grossly normal heart sounds.  Good peripheral circulation. Respiratory: Normal respiratory effort.  No retractions. Lungs CTAB. Gastrointestinal: Soft and nontender. No distention. Positive bowel sounds Musculoskeletal: Tenderness to palpation over left SI joint with positive straight leg raise on the left. Neurologic:  Normal speech and language.  Skin:  Skin is warm, dry and intact.  Psychiatric: Mood and affect are normal.   ____________________________________________   LABS (all labs ordered are listed, but only abnormal results are displayed)  Labs Reviewed - No data to display ____________________________________________  EKG  None ____________________________________________  RADIOLOGY  None ____________________________________________   PROCEDURES  Procedure(s) performed: None  Critical Care performed: No  ____________________________________________   INITIAL IMPRESSION / ASSESSMENT AND PLAN / ED COURSE  Pertinent labs & imaging results that were available during my care of the patient were reviewed by me and considered in my medical decision making (see chart for details).  This is a 29 year old male who comes in today with left SI joint pain. The patient is in detox for drugs and alcohol abuse so he does not want any pain medication. I will give the patient a shot of Toradol 60 mg IM 1 as well as a Lidoderm patch to his SI joint. The patient reports he does some heavy lifting at work and has injured his back in the past at work. I will also give the patient a dose of Flexeril which may help with his symptoms and have him follow-up with orthopedic surgery. Since the patient was recently on steroids I will hold off on the steroids  at this time an attempt to treat his pain with other anti-inflammatories.  The patient's pain is improved. He will be discharged home. ____________________________________________   FINAL CLINICAL IMPRESSION(S) / ED DIAGNOSES  Final diagnoses:  Sciatica of left side      Rebecka Apley, MD 11/23/14 [phone]

## 2014-11-23 NOTE — ED Notes (Signed)
Pt presents to ED with sciatica pain. Dx with the same about a month ago and was given a "prednisone shot"; states he felt better for a little while. Symptoms started to worsen again on Saturday. [facility] and medications were adjusted without relief. Pt ambulatory with steady gait. No distress noted at this time.

## 2014-11-23 NOTE — ED Notes (Signed)
Pt alert x4 respirations easy non labored.  

## 2014-11-23 NOTE — ED Notes (Signed)
Pt in with co left lower back pain radiating to left buttocks and left leg. States hx of sciatica has been here for the same.  States symptoms are similar, states hx of back injury at work about 1 month ago and sciatica has been aggravated since.  Pt is ambulatory at this time with no distress noted.

## 2014-11-23 NOTE — Discharge Instructions (Signed)
Radicular Pain °Radicular pain in either the arm or leg is usually from a bulging or herniated disk in the spine. A piece of the herniated disk may press against the nerves as the nerves exit the spine. This causes pain which is felt at the tips of the nerves down the arm or leg. Other causes of radicular pain may include: °· Fractures. °· Heart disease. °· Cancer. °· An abnormal and usually degenerative state of the nervous system or nerves (neuropathy). °Diagnosis may require CT or MRI scanning to determine the primary cause.  °Nerves that start at the neck (nerve roots) may cause radicular pain in the outer shoulder and arm. It can spread down to the thumb and fingers. The symptoms vary depending on which nerve root has been affected. In most cases radicular pain improves with conservative treatment. Neck problems may require physical therapy, a neck collar, or cervical traction. Treatment may take many weeks, and surgery may be considered if the symptoms do not improve.  °Conservative treatment is also recommended for sciatica. Sciatica causes pain to radiate from the lower back or buttock area down the leg into the foot. Often there is a history of back problems. Most patients with sciatica are better after 2 to 4 weeks of rest and other supportive care. Short term bed rest can reduce the disk pressure considerably. Sitting, however, is not a good position since this increases the pressure on the disk. You should avoid bending, lifting, and all other activities which make the problem worse. Traction can be used in severe cases. Surgery is usually reserved for patients who do not improve within the first months of treatment. °Only take over-the-counter or prescription medicines for pain, discomfort, or fever as directed by your caregiver. Narcotics and muscle relaxants may help by relieving more severe pain and spasm and by providing mild sedation. Cold or massage can give significant relief. Spinal manipulation  is not recommended. It can increase the degree of disc protrusion. Epidural steroid injections are often effective treatment for radicular pain. These injections deliver medicine to the spinal nerve in the space between the protective covering of the spinal cord and back bones (vertebrae). Your caregiver can give you more information about steroid injections. These injections are most effective when given within two weeks of the onset of pain.  °You should see your caregiver for follow up care as recommended. A program for neck and back injury rehabilitation with stretching and strengthening exercises is an important part of management.  °SEEK IMMEDIATE MEDICAL CARE IF: °· You develop increased pain, weakness, or numbness in your arm or leg. °· You develop difficulty with bladder or bowel control. °· You develop abdominal pain. °  °This information is not intended to replace advice given to you by your health care provider. Make sure you discuss any questions you have with your health care provider. °  °Document Released: 03/07/2004 Document Revised: 02/18/2014 Document Reviewed: 08/24/2014 °Elsevier Interactive Patient Education ©2016 [employer] ° °Sciatica °Sciatica is pain, weakness, numbness, or tingling along the path of the sciatic nerve. The nerve starts in the lower back and runs down the back of each leg. The nerve controls the muscles in the lower leg and in the back of the knee, while also providing sensation to the back of the thigh, lower leg, and the sole of your foot. Sciatica is a symptom of another medical condition. For instance, nerve damage or certain conditions, such as a herniated disk or bone spur on the   spine, pinch or put pressure on the sciatic nerve. This causes the pain, weakness, or other sensations normally associated with sciatica. Generally, sciatica only affects one side of the body. °CAUSES  °· Herniated or slipped disc. °· Degenerative disk disease. °· A pain disorder involving  the narrow muscle in the buttocks (piriformis syndrome). °· Pelvic injury or fracture. °· Pregnancy. °· Tumor (rare). °SYMPTOMS  °Symptoms can vary from mild to very severe. The symptoms usually travel from the low back to the buttocks and down the back of the leg. Symptoms can include: °· Mild tingling or dull aches in the lower back, leg, or hip. °· Numbness in the back of the calf or sole of the foot. °· Burning sensations in the lower back, leg, or hip. °· Sharp pains in the lower back, leg, or hip. °· Leg weakness. °· Severe back pain inhibiting movement. °These symptoms may get worse with coughing, sneezing, laughing, or prolonged sitting or standing. Also, being overweight may worsen symptoms. °DIAGNOSIS  °Your caregiver will perform a physical exam to look for common symptoms of sciatica. He or she may ask you to do certain movements or activities that would trigger sciatic nerve pain. Other tests may be performed to find the cause of the sciatica. These may include: °· Blood tests. °· X-rays. °· Imaging tests, such as an MRI or CT scan. °TREATMENT  °Treatment is directed at the cause of the sciatic pain. Sometimes, treatment is not necessary and the pain and discomfort goes away on its own. If treatment is needed, your caregiver may suggest: °· Over-the-counter medicines to relieve pain. °· Prescription medicines, such as anti-inflammatory medicine, muscle relaxants, or narcotics. °· Applying heat or ice to the painful area. °· Steroid injections to lessen pain, irritation, and inflammation around the nerve. °· Reducing activity during periods of pain. °· Exercising and stretching to strengthen your abdomen and improve flexibility of your spine. Your caregiver may suggest losing weight if the extra weight makes the back pain worse. °· Physical therapy. °· Surgery to eliminate what is pressing or pinching the nerve, such as a bone spur or part of a herniated disk. °HOME CARE INSTRUCTIONS  °· Only take  over-the-counter or prescription medicines for pain or discomfort as directed by your caregiver. °· Apply ice to the affected area for 20 minutes, 3-4 times a day for the first 48-72 hours. Then try heat in the same way. °· Exercise, stretch, or perform your usual activities if these do not aggravate your pain. °· Attend physical therapy sessions as directed by your caregiver. °· Keep all follow-up appointments as directed by your caregiver. °· Do not wear high heels or shoes that do not provide proper support. °· Check your mattress to see if it is too soft. A firm mattress may lessen your pain and discomfort. °SEEK IMMEDIATE MEDICAL CARE IF:  °· You lose control of your bowel or bladder (incontinence). °· You have increasing weakness in the lower back, pelvis, buttocks, or legs. °· You have redness or swelling of your back. °· You have a burning sensation when you urinate. °· You have pain that gets worse when you lie down or awakens you at night. °· Your pain is worse than you have experienced in the past. °· Your pain is lasting longer than 4 weeks. °· You are suddenly losing weight without reason. °MAKE SURE YOU: °· Understand these instructions. °· Will watch your condition. °· Will get help right away if you are not doing   well or get worse. °  °This information is not intended to replace advice given to you by your health care provider. Make sure you discuss any questions you have with your health care provider. °  °Document Released: 01/22/2001 Document Revised: 10/19/2014 Document Reviewed: 06/09/2011 °Elsevier Interactive Patient Education ©2016 Elsevier Inc. ° °

## 2015-02-14 ENCOUNTER — Ambulatory Visit: Payer: Self-pay

## 2015-10-05 DIAGNOSIS — I10 Essential (primary) hypertension: Secondary | ICD-10-CM

## 2017-03-19 ENCOUNTER — Emergency Department (HOSPITAL_BASED_OUTPATIENT_CLINIC_OR_DEPARTMENT_OTHER)
Admission: EM | Admit: 2017-03-19 | Discharge: 2017-03-19 | Disposition: A | Discharge status: Home / Self Care | Payer: Self-pay | Attending: Physician Assistant | Admitting: Physician Assistant

## 2017-03-19 ENCOUNTER — Encounter (HOSPITAL_BASED_OUTPATIENT_CLINIC_OR_DEPARTMENT_OTHER): Payer: Self-pay

## 2017-03-19 ENCOUNTER — Other Ambulatory Visit: Payer: Self-pay

## 2017-03-19 DIAGNOSIS — F909 Attention-deficit hyperactivity disorder, unspecified type: Secondary | ICD-10-CM | POA: Insufficient documentation

## 2017-03-19 DIAGNOSIS — I10 Essential (primary) hypertension: Secondary | ICD-10-CM | POA: Insufficient documentation

## 2017-03-19 DIAGNOSIS — K047 Periapical abscess without sinus: Secondary | ICD-10-CM

## 2017-03-19 DIAGNOSIS — F17290 Nicotine dependence, other tobacco product, uncomplicated: Secondary | ICD-10-CM | POA: Insufficient documentation

## 2017-03-19 MED ORDER — PENICILLIN V POTASSIUM 500 MG PO TABS: 500.0000 mg | ORAL_TABLET | Freq: Three times a day (TID) | ORAL | 0 refills | Status: DC

## 2017-03-19 NOTE — ED Provider Notes (Signed)
[facility] EMERGENCY DEPARTMENT Provider Note   CSN: [id number] Arrival date & time: 03/19/17  1652     History   Chief Complaint Chief Complaint  Patient presents with  . Abscess    HPI Timothy Moses is a 32 y.o. male.  Patient presents with 1-2 months of a right lower dental abscess in the area of the cavity.  Patient states that the area has swollen and broken several times.  It hurts to chew on that side.  Pain radiates up to side of his face.  No facial swelling or fevers.  No neck swelling or difficulty breathing.  No previous antibiotics.  He has not seen a dentist.  Onset of symptoms acute.  Course is waxing and waning.  Nothing makes symptoms better or worse.      Past Medical History:  Diagnosis Date  . ADHD (attention deficit hyperactivity disorder)   . Anxiety   . Depression     Patient Active Problem List   Diagnosis Date Noted  . Hypertension 11/08/2014  . Attention deficit hyperactivity disorder (ADHD), combined type   . Alcohol dependence with uncomplicated withdrawal (HCC)   . Alcohol dependence (HCC) 05/18/2014  . ADHD (attention deficit hyperactivity disorder) 05/18/2014  . Social anxiety disorder 05/18/2014  . Polysubstance dependence (HCC) 05/17/2014  . Overdose 05/17/2014  . Depression, major, severe recurrence (HCC) 05/17/2014  . Substance induced mood disorder (HCC) 05/17/2014  . Drug ingestion     History reviewed. No pertinent surgical history.     Home Medications    Prior to Admission medications   Medication Sig Start Date End Date Taking? Authorizing Provider  penicillin v potassium (VEETID) 500 MG tablet Take 1 tablet (500 mg total) by mouth 3 (three) times daily. 03/19/17   Renne Crigler, PA-C    Family History Family History  Problem Relation Age of Onset  . COPD Mother   . Cancer Mother        Ovarian  . Anuerysm Father        Brain x2    Social History Social History   Tobacco Use  . Smoking status:  Current Every Day Smoker    Packs/day: 0.50    Types: Cigarettes, E-cigarettes  . Smokeless tobacco: Former Engineer, water Use Topics  . Alcohol use: No  . Drug use: No     Allergies   Patient has no known allergies.   Review of Systems Review of Systems  Constitutional: Negative for fever.  HENT: Positive for dental problem. Negative for ear pain, facial swelling, sore throat and trouble swallowing.   Respiratory: Negative for shortness of breath and stridor.   Musculoskeletal: Negative for neck pain.  Skin: Negative for color change.  Neurological: Negative for headaches.     Physical Exam Updated Vital Signs BP (!) 153/74 (BP Location: Left Arm)   Pulse 68   Temp 98.1 F (36.7 C) (Oral)   Resp 16   Ht 6\' 1"  (1.854 m)   Wt 83.1 kg (183 lb 3.2 oz)   SpO2 99%   BMI 24.17 kg/m   Physical Exam  Constitutional: He appears well-developed and well-nourished.  HENT:  Head: Normocephalic and atraumatic.  Right Ear: Tympanic membrane, external ear and ear canal normal.  Left Ear: Tympanic membrane, external ear and ear canal normal.  Nose: Nose normal.  Mouth/Throat: Uvula is midline, oropharynx is clear and moist and mucous membranes are normal. No trismus in the jaw. Abnormal dentition. Dental caries present. No  dental abscesses or uvula swelling. No tonsillar abscesses.  Patient with gum erythema and swelling without palpable abscess in the area of the right lower second molar which is broken to the gumline.  No gross facial swelling.  No signs of Ludwig's angina  Eyes: Pupils are equal, round, and reactive to light.  Neck: Normal range of motion. Neck supple.  No neck swelling or Lugwig's angina  Neurological: He is alert.  Skin: Skin is warm and dry.  Psychiatric: He has a normal mood and affect.  Nursing note and vitals reviewed.    ED Treatments / Results   Procedures Procedures (including critical care time)  Medications Ordered in ED Medications - No  data to display   Initial Impression / Assessment and Plan / ED Course  I have reviewed the triage vital signs and the nursing notes.  Pertinent labs & imaging results that were available during my care of the patient were reviewed by me and considered in my medical decision making (see chart for details).     Patient seen and examined.   Vital signs reviewed and are as follows: BP (!) 153/74 (BP Location: Left Arm)   Pulse 68   Temp 98.1 F (36.7 C) (Oral)   Resp 16   Ht 6\' 1"  (1.854 m)   Wt 83.1 kg (183 lb 3.2 oz)   SpO2 99%   BMI 24.17 kg/m   Patient counseled to take prescribed medications as directed, return with worsening facial or neck swelling, and to follow-up with their dentist as soon as possible.    Final Clinical Impressions(s) / ED Diagnoses   Final diagnoses:  Dental abscess   Patient with toothache. No fever. Exam unconcerning for Ludwig's angina or other deep tissue infection in neck.   As there is gum swelling, erythema, will treat with antibiotic and pain medicine. Urged patient to follow-up with dentist.     ED Discharge Orders        Ordered    penicillin v potassium (VEETID) 500 MG tablet  3 times daily     03/19/17 1830       Renne CriglerGeiple, Peter Keyworth, PA-C 03/19/17 1834    Abelino DerrickMackuen, Courteney Lyn, MD 03/20/17 1452

## 2017-03-19 NOTE — Discharge Instructions (Signed)
Please read and follow all provided instructions.  Your diagnoses today include:  1. Dental abscess     The exam and treatment you received today has been provided on an emergency basis only. This is not a substitute for complete medical or dental care.  Tests performed today include:  Vital signs. See below for your results today.   Medications prescribed:   Penicillin - antibiotic  You have been prescribed an antibiotic medicine: take the entire course of medicine even if you are feeling better. Stopping early can cause the antibiotic not to work.  Take any prescribed medications only as directed.  Home care instructions:  Follow any educational materials contained in this packet.  Follow-up instructions: Please follow-up with your dentist for further evaluation of your symptoms.   Dental Assistance: See attached dental referrals  Return instructions:   Please return to the Emergency Department if you experience worsening symptoms.  Please return if you develop a fever, you develop more swelling in your face or neck, you have trouble breathing or swallowing food.  Please return if you have any other emergent concerns.  Additional Information:  Your vital signs today were: BP (!) 153/74 (BP Location: Left Arm)    Pulse 68    Temp 98.1 F (36.7 C) (Oral)    Resp 16    Ht 6\' 1"  (1.854 m)    Wt 83.1 kg (183 lb 3.2 oz)    SpO2 99%    BMI 24.17 kg/m  If your blood pressure (BP) was elevated above 135/85 this visit, please have this repeated by your doctor within one month. --------------

## 2017-03-19 NOTE — ED Triage Notes (Signed)
C/o dental "abscess" to right lower x 2 months-NAD-steady gait

## 2017-09-18 ENCOUNTER — Telehealth: Payer: Self-pay | Admitting: Family Medicine

## 2017-09-18 NOTE — Telephone Encounter (Signed)
Called and spoke with patient. Confirmed appointment with patient, advised of time, building number and late policy. °

## 2017-09-19 ENCOUNTER — Other Ambulatory Visit: Payer: Self-pay

## 2017-09-19 ENCOUNTER — Encounter: Payer: Self-pay | Admitting: Family Medicine

## 2017-09-19 ENCOUNTER — Ambulatory Visit: Payer: BLUE CROSS/BLUE SHIELD | Admitting: Family Medicine

## 2017-09-19 VITALS — BP 135/85 | HR 82 | Temp 98.0°F | Resp 16 | Ht 73.0 in | Wt 182.8 lb

## 2017-09-19 DIAGNOSIS — F902 Attention-deficit hyperactivity disorder, combined type: Secondary | ICD-10-CM | POA: Diagnosis not present

## 2017-09-19 NOTE — Patient Instructions (Addendum)
[facility] [phone]   [facility]  Psychiatry & neurology - psychiatry [address], [place], [place] [address] [phone]  [facility] [address] [place], [place] [address] [phone]     IF you received an x-ray today, you will receive an invoice from [facility]. Please contact [facility] at [phone] with questions or concerns regarding your invoice.   IF you received labwork today, you will receive an invoice from [facility]. Please contact [facility] at [phone] with questions or concerns regarding your invoice.   Our billing staff will not be able to assist you with questions regarding bills from these companies.  You will be contacted with the lab results as soon as they are available. The fastest way to get your results is to activate your My Chart account. Instructions are located on the last page of this paperwork. If you have not heard from us regarding the results in 2 weeks, please contact this office.

## 2017-09-19 NOTE — Progress Notes (Signed)
Chief Complaint  Patient presents with  . 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  He feels like it is attention focus and not ability  It is stressing him out and makes him worry about his jobs He makes sticky notes and still misses things   He drinks one soda a day He states that he works on his own separate jobs and someone on a team will check his work and he worries about the details that are being missed He denies irritability  Depression screen PHQ 2/9 09/19/2017  Decreased Interest 0  Down, Depressed, Hopeless 0  PHQ - 2 Score 0    He reports that he does not have anxious or depressed moods  He states that he zones out in conversations He states that he nods but may block out what someone says even at home.   When he was first diagnosed in 6th grade he was started on concerta, then Strattera then adderall.    Past Medical History:  Diagnosis Date  . ADHD (attention deficit hyperactivity disorder)   . Anxiety   . Depression     No current outpatient medications on file.   No current facility-administered medications for this visit.     Allergies: No Known Allergies  History reviewed. No pertinent surgical history.  Social History   Socioeconomic History  . Marital status: Significant Other    Spouse name: Not on file  . Number of children: Not on file  . Years of education: Not on file  . Highest education level: Not on file  Occupational History  . Not on file  Social Needs  . Financial resource strain: Not on file  . Food insecurity:    Worry: Not on file    Inability: Not on  file  . Transportation needs:    Medical: Not on file    Non-medical: Not on file  Tobacco Use  . Smoking status: Current Every Day Smoker    Packs/day: 0.50    Types: Cigarettes, E-cigarettes  . Smokeless tobacco: Former Engineer, water and Sexual Activity  . Alcohol use: No  . Drug use: No  . Sexual activity: Not on file  Lifestyle  . Physical activity:    Days per week: Not on file    Minutes per session: Not on file  . Stress: Not on file  Relationships  . Social connections:    Talks on phone: Not on file    Gets together: Not on file    Attends religious service: Not on file    Active member of club or organization: Not on file    Attends meetings of clubs or organizations: Not on file    Relationship status: Not on file  Other Topics Concern  . Not on file  Social History Narrative   ** Merged History Encounter **        Family History  Problem Relation Age of Onset  . COPD Mother   . Cancer Mother        Ovarian  . Anuerysm Father  Brain x2     ROS Review of Systems See HPI Constitution: No fevers or chills No malaise No diaphoresis Skin: No rash or itching Eyes: no blurry vision, no double vision GU: no dysuria or hematuria Neuro: no dizziness or headaches all others reviewed and negative   Objective: Vitals:   09/19/17 1410  BP: 135/85  Pulse: 82  Resp: 16  Temp: 98 F (36.7 C)  SpO2: 99%  Weight: 182 lb 12.8 oz (82.9 kg)  Height: 6\' 1"  (1.854 m)    Physical Exam  Constitutional: He is oriented to person, place, and time. He appears well-developed and well-nourished.  HENT:  Head: Normocephalic and atraumatic.  Eyes: Conjunctivae and EOM are normal.  Cardiovascular: Normal rate, regular rhythm and normal heart sounds.  No murmur heard. Pulmonary/Chest: Effort normal and breath sounds normal. No stridor. No respiratory distress. He has no wheezes.  Neurological: He is alert and oriented to person, place, and time.  Skin: Skin  is warm. Capillary refill takes less than 2 seconds.  Psychiatric: He has a normal mood and affect. His behavior is normal. Judgment and thought content normal.    Assessment and Plan Jahni was seen today for establish care.  Diagnoses and all orders for this visit:  Attention deficit hyperactivity disorder (ADHD), combined type -     Ambulatory referral to Psychiatry -     Comprehensive metabolic panel -     CBC -     TSH  [facility] and discussion of medications Pt to check his BH benefits with his insurance Will check labs to check for underlying hormone changes Unable to log in to   [employer] Fantasia Jinkins

## 2017-09-20 LAB — COMPREHENSIVE METABOLIC PANEL
ALBUMIN: 4.7 g/dL (ref 3.5–5.5)
ALK PHOS: 71 IU/L (ref 39–117)
ALT: 8 IU/L (ref 0–44)
AST: 10 IU/L (ref 0–40)
Albumin/Globulin Ratio: 2.2 (ref 1.2–2.2)
BUN / CREAT RATIO: 10 (ref 9–20)
BUN: 8 mg/dL (ref 6–20)
Bilirubin Total: 0.4 mg/dL (ref 0.0–1.2)
CALCIUM: 9.4 mg/dL (ref 8.7–10.2)
CO2: 22 mmol/L (ref 20–29)
Chloride: 103 mmol/L (ref 96–106)
Creatinine, Ser: 0.79 mg/dL (ref 0.76–1.27)
GFR calc Af Amer: 137 mL/min/{1.73_m2} (ref >59)
GFR, EST NON AFRICAN AMERICAN: 119 mL/min/{1.73_m2} (ref >59)
GLOBULIN, TOTAL: 2.1 g/dL (ref 1.5–4.5)
GLUCOSE: 79 mg/dL (ref 65–99)
Potassium: 4.1 mmol/L (ref 3.5–5.2)
SODIUM: 141 mmol/L (ref 134–144)
TOTAL PROTEIN: 6.8 g/dL (ref 6.0–8.5)

## 2017-09-20 LAB — CBC
HEMATOCRIT: 40.3 % (ref 37.5–51.0)
Hemoglobin: 13.9 g/dL (ref 13.0–17.7)
MCH: 30.5 pg (ref 26.6–33.0)
MCHC: 34.5 g/dL (ref 31.5–35.7)
MCV: 88 fL (ref 79–97)
Platelets: 322 10*3/uL (ref 150–450)
RBC: 4.56 x10E6/uL (ref 4.14–5.80)
RDW: 13 % (ref 12.3–15.4)
WBC: 6.1 10*3/uL (ref 3.4–10.8)

## 2017-09-20 LAB — TSH: TSH: 1.5 u[IU]/mL (ref 0.450–4.500)

## 2017-09-24 ENCOUNTER — Other Ambulatory Visit: Payer: Self-pay

## 2017-09-24 ENCOUNTER — Encounter (HOSPITAL_BASED_OUTPATIENT_CLINIC_OR_DEPARTMENT_OTHER): Payer: Self-pay

## 2017-09-24 ENCOUNTER — Emergency Department (HOSPITAL_BASED_OUTPATIENT_CLINIC_OR_DEPARTMENT_OTHER)
Admission: EM | Admit: 2017-09-24 | Discharge: 2017-09-24 | Disposition: A | Discharge status: Home / Self Care | Payer: BLUE CROSS/BLUE SHIELD | Attending: Emergency Medicine | Admitting: Emergency Medicine

## 2017-09-24 DIAGNOSIS — Z87891 Personal history of nicotine dependence: Secondary | ICD-10-CM | POA: Insufficient documentation

## 2017-09-24 DIAGNOSIS — K0889 Other specified disorders of teeth and supporting structures: Secondary | ICD-10-CM | POA: Diagnosis present

## 2017-09-24 DIAGNOSIS — I10 Essential (primary) hypertension: Secondary | ICD-10-CM | POA: Diagnosis not present

## 2017-09-24 MED ORDER — ONDANSETRON 4 MG PO TBDP
4.0000 mg | ORAL_TABLET | Freq: Once | ORAL | Status: AC
  Administered 2017-09-24: 4 mg via ORAL
  Filled 2017-09-24: qty 1

## 2017-09-24 MED ORDER — OXYCODONE-ACETAMINOPHEN 5-325 MG PO TABS: 2.0000 | ORAL_TABLET | Freq: Three times a day (TID) | ORAL | 0 refills | Status: AC | PRN

## 2017-09-24 MED ORDER — OXYCODONE-ACETAMINOPHEN 5-325 MG PO TABS
1.0000 | ORAL_TABLET | Freq: Once | ORAL | Status: AC
  Administered 2017-09-24: 1 via ORAL
  Filled 2017-09-24: qty 1

## 2017-09-24 MED ORDER — PENICILLIN V POTASSIUM 500 MG PO TABS: 500.0000 mg | ORAL_TABLET | Freq: Three times a day (TID) | ORAL | 0 refills | Status: AC

## 2017-09-24 NOTE — ED Provider Notes (Signed)
[facility] EMERGENCY DEPARTMENT Provider Note  CSN: [id number] Arrival date & time: 09/24/17  1755  History   Chief Complaint Chief Complaint  Patient presents with  . Dental Pain    HPI Timothy Moses is a 32 y.o. male with a medical history of HTN who presented to the ED for dental pain x3 days. Patient reports having 2-3 recent dental procedures for impacted teeth over the last couple of weeks. He describes sharp, throbbing lower dental pain at the site of tooth which is supposed to undergo a root canal tomorrow. Patient has tried topical anesthetics, warm compresses, Tylenol and ibuprofen prior to coming to the ED with little to no relief. Denies fever, facial swelling, trismus, neck pain or otalgia.  Past Medical History:  Diagnosis Date  . ADHD (attention deficit hyperactivity disorder)   . Anxiety   . Depression     Patient Active Problem List   Diagnosis Date Noted  . Hypertension 11/08/2014  . Attention deficit hyperactivity disorder (ADHD), combined type   . Alcohol dependence with uncomplicated withdrawal (HCC)   . Alcohol dependence (HCC) 05/18/2014  . ADHD (attention deficit hyperactivity disorder) 05/18/2014  . Social anxiety disorder 05/18/2014  . Polysubstance dependence (HCC) 05/17/2014  . Overdose 05/17/2014  . Depression, major, severe recurrence (HCC) 05/17/2014  . Substance induced mood disorder (HCC) 05/17/2014  . Drug ingestion     History reviewed. No pertinent surgical history.      Home Medications    Prior to Admission medications   Medication Sig Start Date End Date Taking? Authorizing Provider  oxyCODONE-acetaminophen (PERCOCET/ROXICET) 5-325 MG tablet Take 2 tablets by mouth every 8 (eight) hours as needed for up to 3 days for severe pain. 09/24/17 09/27/17  Simren Popson, Jerrel IvoryGabrielle I, PA-C  penicillin v potassium (VEETID) 500 MG tablet Take 1 tablet (500 mg total) by mouth 3 (three) times daily for 7 days. 09/24/17 10/01/17  Sumayyah Custodio,  Sharyon MedicusGabrielle I, PA-C    Family History Family History  Problem Relation Age of Onset  . COPD Mother   . Cancer Mother        Ovarian  . Anuerysm Father        Brain x2    Social History Social History   Tobacco Use  . Smoking status: Former Smoker    Packs/day: 0.50    Types: Cigarettes, E-cigarettes    Last attempt to quit: 09/24/2016    Years since quitting: 1.0  . Smokeless tobacco: Former Engineer, waterUser  Substance Use Topics  . Alcohol use: No  . Drug use: No     Allergies   Patient has no known allergies.   Review of Systems Review of Systems  Constitutional: Negative for chills and fever.  HENT: Positive for dental problem. Negative for congestion, ear pain, facial swelling and trouble swallowing.   Musculoskeletal: Negative for neck pain.  Skin: Negative.   Neurological: Negative for dizziness, speech difficulty, weakness, light-headedness, numbness and headaches.  Hematological: Negative.    Physical Exam Updated Vital Signs BP (!) 151/102 (BP Location: Left Arm)   Pulse 78   Temp 98.2 F (36.8 C)   Resp 18   Ht 6\' 1"  (1.854 m)   Wt 83 kg   SpO2 99%   BMI 24.14 kg/m   Physical Exam  Constitutional: He appears well-developed and well-nourished.  HENT:  Head: Normocephalic and atraumatic.  Right Ear: Tympanic membrane, external ear and ear canal normal.  Left Ear: Tympanic membrane, external ear and ear canal normal.  Nose: Nose normal.  Mouth/Throat: Uvula is midline, oropharynx is clear and moist and mucous membranes are normal. Abnormal dentition. No dental abscesses or dental caries.    Neck: Normal range of motion and full passive range of motion without pain. Neck supple.  Skin: Skin is warm and intact. Capillary refill takes less than 2 seconds. No abrasion, no bruising and no ecchymosis noted.  Nursing note and vitals reviewed.    ED Treatments / Results  Labs (all labs ordered are listed, but only abnormal results are displayed) Labs Reviewed  - No data to display  EKG None  Radiology No results found.  Procedures Procedures (including critical care time)  Medications Ordered in ED Medications  oxyCODONE-acetaminophen (PERCOCET/ROXICET) 5-325 MG per tablet 1 tablet (1 tablet Oral Given 09/24/17 1913)  ondansetron (ZOFRAN-ODT) disintegrating tablet 4 mg (4 mg Oral Given 09/24/17 1913)     Initial Impression / Assessment and Plan / ED Course  Triage vital signs and the nursing notes have been reviewed.  Pertinent labs & imaging results that were available during care of the patient were reviewed and considered in medical decision making (see chart for details).   Patient presents in no acute distress. He is afebrile. Elevated BP upon arrival to ED, but patient seen grasping right side of his face in pain. Patient denies systemic s/s that would suggest a widespread infection. On physical exam, there is no erythema, swelling, induration or fluctuatnce in the mouth or face that would suggest an acute dental infection or abscess. However, given the pt's extensive dental history and recent dental procedures, it is possible that there is a gingival infection that cannot be visualized. Will prescribe prophylactic antibiotics and give pain medication. Patient closely followed by adentist and is scheduled to have dental procedure tomorrow 09/25/17.  Final Clinical Impressions(s) / ED Diagnoses  1. Dental Pain. Scheduled to have root canal tomorrow 09/25/17. Patient has tried multiple agents for pain relief. No current pain meds as confirmed with [place] Controlled Substance Database. Rx for Percocet x3 days prescribed.  Dispo: Home. After thorough clinical evaluation, this patient is determined to be medically stable and can be safely discharged with the previously mentioned treatment and/or outpatient follow-up/referral(s). At this time, there are no other apparent medical conditions that require further screening, evaluation or  treatment.   Final diagnoses:  Pain, dental    ED Discharge Orders         Ordered    oxyCODONE-acetaminophen (PERCOCET/ROXICET) 5-325 MG tablet  Every 8 hours PRN     09/24/17 1856    penicillin v potassium (VEETID) 500 MG tablet  3 times daily     09/24/17 [address]            Patience Nuzzo I, PA-C 09/25/17 1731    Terrilee FilesButler, Michael C, MD 09/26/17 1347

## 2017-09-24 NOTE — ED Triage Notes (Signed)
Dental pain suddenly worse today. Pt is supposed to have a root canal tomorrow, but could not deal with the pain today.

## 2017-09-24 NOTE — Discharge Instructions (Addendum)
I have prescribed you a short course pain medication to get you to and through your procedure tomorrow. I have also prescribed some antibiotics as well to treat possible infection as well.  Good luck with your procedure tomorrow!

## 2018-03-16 ENCOUNTER — Emergency Department (HOSPITAL_BASED_OUTPATIENT_CLINIC_OR_DEPARTMENT_OTHER)
Admission: EM | Admit: 2018-03-16 | Discharge: 2018-03-16 | Disposition: A | Discharge status: Home / Self Care | Payer: BLUE CROSS/BLUE SHIELD | Attending: Emergency Medicine | Admitting: Emergency Medicine

## 2018-03-16 ENCOUNTER — Encounter (HOSPITAL_BASED_OUTPATIENT_CLINIC_OR_DEPARTMENT_OTHER): Payer: Self-pay

## 2018-03-16 ENCOUNTER — Other Ambulatory Visit: Payer: Self-pay

## 2018-03-16 DIAGNOSIS — Z87891 Personal history of nicotine dependence: Secondary | ICD-10-CM | POA: Insufficient documentation

## 2018-03-16 DIAGNOSIS — W228XXA Striking against or struck by other objects, initial encounter: Secondary | ICD-10-CM | POA: Insufficient documentation

## 2018-03-16 DIAGNOSIS — Y9301 Activity, walking, marching and hiking: Secondary | ICD-10-CM | POA: Insufficient documentation

## 2018-03-16 DIAGNOSIS — Y999 Unspecified external cause status: Secondary | ICD-10-CM | POA: Insufficient documentation

## 2018-03-16 DIAGNOSIS — Y929 Unspecified place or not applicable: Secondary | ICD-10-CM | POA: Insufficient documentation

## 2018-03-16 DIAGNOSIS — I10 Essential (primary) hypertension: Secondary | ICD-10-CM | POA: Insufficient documentation

## 2018-03-16 DIAGNOSIS — S61411A Laceration without foreign body of right hand, initial encounter: Secondary | ICD-10-CM | POA: Insufficient documentation

## 2018-03-16 MED ORDER — LIDOCAINE HCL 2 % IJ SOLN
10.0000 mL | Freq: Once | INTRAMUSCULAR | Status: AC
  Administered 2018-03-16: 200 mg

## 2018-03-16 MED ORDER — BACITRACIN ZINC 500 UNIT/GM EX OINT: TOPICAL_OINTMENT | Freq: Once | CUTANEOUS | Status: DC

## 2018-03-16 NOTE — Discharge Instructions (Signed)
It was my pleasure taking care of you today!   Keep wound clean with mild soap and water. Keep area covered with a topical antibiotic ointment and bandage, keep bandage dry, and do not submerge in water for 24 hours. Ice and elevate for additional pain relief and swelling. Alternate between ibuprofen and Tylenol for additional pain relief. Follow up with your primary care doctor, [facility] or ER in approximately 7-10 days for wound recheck and suture removal. Monitor area for signs of infection to include, but not limited to: increasing pain, spreading redness, drainage/pus, worsening swelling, or fevers. Return to emergency department for emergent changing or worsening symptoms.   WOUND CARE Keep area clean and dry for 24 hours. Do not remove bandage, if applied. After 24 hours,you should change it at least once a day. Also, change the dressing if it becomes wet or dirty, or as directed by your caregiver.  Wash the wound with soap and water 2 times a day. Rinse the wound off with water to remove all soap. Pat the wound dry with a clean towel.  You may shower as usual after the first 24 hours. Do not soak the wound in water until the sutures are removed.  Return if you experience any of the following signs of infection: Swelling, redness, pus drainage, streaking, fever >101.0 F Return if you experience excessive bleeding that does not stop after 15-20 minutes of constant, firm pressure.

## 2018-03-16 NOTE — ED Provider Notes (Signed)
[facility] EMERGENCY DEPARTMENT Provider Note   CSN: [id number] Arrival date & time: 03/16/18  1628     History   Chief Complaint Chief Complaint  Patient presents with  . Hand Injury    HPI Timothy Moses is a 33 y.o. male.  The history is provided by the patient and medical records. No language interpreter was used.   Timothy Moses is a 33 y.o. male who presents to the emergency department for laceration to the palm of his right hand which occurred just prior to arrival.  Patient states he was walking down the stairs when he lost his balance and grabbed onto the handrail which had an exposed nail, cutting his hand.  Tetanus is up-to-date within the last year.  He applied pressure and that stopped the bleeding.  No other medications or treatments prior to arrival for symptoms.  No numbness, tingling or weakness.  No problems with his grip or range of motion.  Past Medical History:  Diagnosis Date  . ADHD (attention deficit hyperactivity disorder)   . Anxiety   . Depression     Patient Active Problem List   Diagnosis Date Noted  . Hypertension 11/08/2014  . Attention deficit hyperactivity disorder (ADHD), combined type   . Alcohol dependence with uncomplicated withdrawal (HCC)   . Alcohol dependence (HCC) 05/18/2014  . ADHD (attention deficit hyperactivity disorder) 05/18/2014  . Social anxiety disorder 05/18/2014  . Polysubstance dependence (HCC) 05/17/2014  . Overdose 05/17/2014  . Depression, major, severe recurrence (HCC) 05/17/2014  . Substance induced mood disorder (HCC) 05/17/2014  . Drug ingestion     History reviewed. No pertinent surgical history.      Home Medications    Prior to Admission medications   Not on File    Family History Family History  Problem Relation Age of Onset  . COPD Mother   . Cancer Mother        Ovarian  . Anuerysm Father        Brain x2    Social History Social History   Tobacco Use  . Smoking status:  Former Smoker    Packs/day: 0.50    Types: Cigarettes, E-cigarettes    Last attempt to quit: 09/24/2016    Years since quitting: 1.4  . Smokeless tobacco: Former Engineer, waterUser  Substance Use Topics  . Alcohol use: No  . Drug use: No     Allergies   Patient has no known allergies.   Review of Systems Review of Systems  Musculoskeletal: Negative for arthralgias and myalgias.  Skin: Positive for wound.  Neurological: Negative for weakness and numbness.     Physical Exam Updated Vital Signs BP (!) 157/89 (BP Location: Left Arm)   Pulse (!) 102   Temp 98.6 F (37 C) (Oral)   Resp 18   Ht 6\' 1"  (1.854 m)   Wt 77.1 kg   SpO2 100%   BMI 22.43 kg/m   Physical Exam Vitals signs and nursing note reviewed.  Constitutional:      General: He is not in acute distress.    Appearance: He is well-developed.  HENT:     Head: Normocephalic and atraumatic.  Neck:     Musculoskeletal: Neck supple.  Cardiovascular:     Rate and Rhythm: Normal rate and regular rhythm.     Heart sounds: Normal heart sounds. No murmur.  Pulmonary:     Effort: Pulmonary effort is normal. No respiratory distress.     Breath sounds: Normal  breath sounds. No wheezing or rales.  Musculoskeletal: Normal range of motion.     Comments: 7 cm laceration to the ulnar side of the right palm.  Quite superficial. Full range of motion.  Good grip strength.  Sensation intact to ulnar, medial and radial nerve distribution.  Skin:    General: Skin is warm and dry.  Neurological:     Mental Status: He is alert.      ED Treatments / Results  Labs (all labs ordered are listed, but only abnormal results are displayed) Labs Reviewed - No data to display  EKG None  Radiology No results found.  Procedures .Marland KitchenLaceration Repair Date/Time: 03/16/2018 5:46 PM Performed by: , Chase Picket, PA-C Authorized by: , Chase Picket, PA-C   Consent:    Consent obtained:  Verbal   Consent given by:  Patient   Risks  discussed:  Pain, infection, poor cosmetic result and poor wound healing Anesthesia (see MAR for exact dosages):    Anesthesia method:  Local infiltration   Local anesthetic:  Lidocaine 2% w/o epi Laceration details:    Location:  Hand   Hand location:  R palm   Length (cm):  7 Repair type:    Repair type:  Simple Pre-procedure details:    Preparation:  Patient was prepped and draped in usual sterile fashion Exploration:    Hemostasis achieved with:  Direct pressure   Wound exploration: wound explored through full range of motion and entire depth of wound probed and visualized     Wound extent: no foreign bodies/material noted, no muscle damage noted and no tendon damage noted   Treatment:    Area cleansed with:  Saline   Amount of cleaning:  Standard   Irrigation solution:  Sterile saline Skin repair:    Repair method:  Sutures   Suture size:  5-0   Suture material:  Prolene   Suture technique:  Simple interrupted   Number of sutures:  9 Approximation:    Approximation:  Close Post-procedure details:    Dressing:  Antibiotic ointment   Patient tolerance of procedure:  Tolerated well, no immediate complications   (including critical care time)  Medications Ordered in ED Medications  bacitracin ointment (has no administration in time range)  lidocaine (XYLOCAINE) 2 % (with pres) injection 200 mg (200 mg Infiltration Given by Other 03/16/18 1657)     Initial Impression / Assessment and Plan / ED Course  I have reviewed the triage vital signs and the nursing notes.  Pertinent labs & imaging results that were available during my care of the patient were reviewed by me and considered in my medical decision making (see chart for details).    Timothy Moses is a 33 y.o. male who presents to ED for laceration of right palm. Wound thoroughly cleaned in ED today. Wound explored and bottom of wound seen in a bloodless field. Quite superficial. Laceration repaired as dictated above.  Patient counseled on home wound care. Follow up with PCP/urgent care or return to ER for suture removal in 7-10 days. Patient was urged to return to the Emergency Department for worsening pain, swelling, expanding erythema especially if it streaks away from the affected area, fever, or for any additional concerns. Patient verbalized understanding. All questions answered.   Final Clinical Impressions(s) / ED Diagnoses   Final diagnoses:  Laceration of right hand without foreign body, initial encounter    ED Discharge Orders    None       ,  Chase PicketJaime Pilcher, PA-C 03/16/18 1806    Mesner, Barbara CowerJason, MD 03/17/18 [phone]

## 2018-03-16 NOTE — ED Notes (Signed)
Bacitracin applied to stitches with tefla and kerlex.

## 2018-03-16 NOTE — ED Triage Notes (Signed)
Pt was going down the stairs and cut his right hand on a nail, long jagged laceration to outer part of right hand, bleeding controlled in triage

## 2019-01-18 ENCOUNTER — Other Ambulatory Visit: Payer: Self-pay

## 2019-01-18 DIAGNOSIS — Z20822 Contact with and (suspected) exposure to covid-19: Secondary | ICD-10-CM

## 2019-01-19 ENCOUNTER — Encounter (HOSPITAL_BASED_OUTPATIENT_CLINIC_OR_DEPARTMENT_OTHER): Payer: Self-pay | Admitting: *Deleted

## 2019-01-19 ENCOUNTER — Other Ambulatory Visit: Payer: Self-pay

## 2019-01-19 ENCOUNTER — Emergency Department (HOSPITAL_BASED_OUTPATIENT_CLINIC_OR_DEPARTMENT_OTHER): Payer: BLUE CROSS/BLUE SHIELD

## 2019-01-19 ENCOUNTER — Emergency Department (HOSPITAL_BASED_OUTPATIENT_CLINIC_OR_DEPARTMENT_OTHER)
Admission: EM | Admit: 2019-01-19 | Discharge: 2019-01-19 | Disposition: A | Discharge status: Home / Self Care | Payer: BLUE CROSS/BLUE SHIELD | Attending: Emergency Medicine | Admitting: Emergency Medicine

## 2019-01-19 DIAGNOSIS — Z87891 Personal history of nicotine dependence: Secondary | ICD-10-CM | POA: Insufficient documentation

## 2019-01-19 DIAGNOSIS — I10 Essential (primary) hypertension: Secondary | ICD-10-CM | POA: Insufficient documentation

## 2019-01-19 DIAGNOSIS — M545 Low back pain, unspecified: Secondary | ICD-10-CM

## 2019-01-19 LAB — NOVEL CORONAVIRUS, NAA: SARS-CoV-2, NAA: NOT DETECTED

## 2019-01-19 MED ORDER — METHOCARBAMOL 500 MG PO TABS: 500.0000 mg | ORAL_TABLET | Freq: Two times a day (BID) | ORAL | 0 refills | Status: DC

## 2019-01-19 MED ORDER — PREDNISONE 20 MG PO TABS: 40.0000 mg | ORAL_TABLET | Freq: Every day | ORAL | 0 refills | Status: DC

## 2019-01-19 MED ORDER — METHOCARBAMOL 500 MG PO TABS
500.0000 mg | ORAL_TABLET | Freq: Two times a day (BID) | ORAL | 0 refills | Status: AC
Start: 2019-01-19 — End: 2019-01-26

## 2019-01-19 MED ORDER — PREDNISONE 20 MG PO TABS: 40.0000 mg | ORAL_TABLET | Freq: Every day | ORAL | 0 refills | Status: AC

## 2019-01-19 MED FILL — METHOCARBAMOL 500 MG TABLET: 500 | 10 days supply | Qty: 20 | Fill #0

## 2019-01-19 MED FILL — predniSONE 20 MG TABS: 20 | 5 days supply | Qty: 10 | Fill #0

## 2019-01-19 NOTE — Discharge Instructions (Addendum)
We discussed the results of your x-ray today.  I prescribed a short course of steroids which should help with your symptoms.  Please be aware these can cause flushness, insomnia, appetite changes.  In addition I have also prescribed a short course of muscle relaxers, these can make you drowsy please avoid driving or drinking alcohol while taking this medication.  The number to Dr. Raeford Razor orthopedist is attached to your paperwork, please schedule an appointment for further follow-up of your back pain.

## 2019-01-19 NOTE — ED Triage Notes (Signed)
C/o lower left back pain x 3 days

## 2019-01-19 NOTE — ED Provider Notes (Signed)
[facility] EMERGENCY DEPARTMENT Provider Note   CSN: [id number] Arrival date & time: 01/19/19  1640     History   Chief Complaint Chief Complaint  Patient presents with  . Back Pain    HPI Timothy Moses is a 33 y.o. male.     33 y.o male with a PMH of ADHD, Anxiety, Depression presents to the ED with a chief complaint of back pain x 3 days. Patient was playing golf over the weekend when he began to feel pain along the midline lumbar spine, reports the pain was worse as he swung at the golf ball.  Patient reports the pain is worse with turning, he states the pain is somewhat relieved by sitting up.  He has tried heat, ibuprofen without improvement in symptoms.  He does have a previous history of compression fracture to his L1, reports he did not follow-up with anyone after this diagnosis.  He denies any fever, prior history of IV drug use, bowel or bladder incontinence, weakness or saddle anesthesia.  The history is provided by the patient.    Past Medical History:  Diagnosis Date  . ADHD (attention deficit hyperactivity disorder)   . Anxiety   . Depression     Patient Active Problem List   Diagnosis Date Noted  . Hypertension 11/08/2014  . Attention deficit hyperactivity disorder (ADHD), combined type   . Alcohol dependence with uncomplicated withdrawal ([place])   . Alcohol dependence ([place]) 05/18/2014  . ADHD (attention deficit hyperactivity disorder) 05/18/2014  . Social anxiety disorder 05/18/2014  . Polysubstance dependence ([place]) 05/17/2014  . Overdose 05/17/2014  . Depression, major, severe recurrence ([place]) 05/17/2014  . Substance induced mood disorder ([place]) 05/17/2014  . Drug ingestion     History reviewed. No pertinent surgical history.      Home Medications    Prior to Admission medications   Medication Sig Start Date End Date Taking? Authorizing Provider  methocarbamol (ROBAXIN) 500 MG tablet Take 1 tablet (500 mg total) by mouth 2 (two) times  daily for 7 days. 01/19/19 01/26/19  Janeece Fitting, PA-C  predniSONE (DELTASONE) 20 MG tablet Take 2 tablets (40 mg total) by mouth daily for 5 days. 01/19/19 01/24/19  Janeece Fitting, PA-C    Family History Family History  Problem Relation Age of Onset  . COPD Mother   . Cancer Mother        Ovarian  . Anuerysm Father        Brain x2    Social History Social History   Tobacco Use  . Smoking status: Former Smoker    Packs/day: 0.50    Types: Cigarettes, E-cigarettes    Quit date: 09/24/2016    Years since quitting: 2.3  . Smokeless tobacco: Former Network engineer Use Topics  . Alcohol use: No  . Drug use: No     Allergies   Patient has no known allergies.   Review of Systems Review of Systems  Constitutional: Negative for fever.  Musculoskeletal: Positive for back pain and myalgias.     Physical Exam Updated Vital Signs BP (!) 145/93 (BP Location: Left Arm)   Pulse 88   Temp 98.7 F (37.1 C) (Oral)   Resp 18   Ht 6' (1.829 m)   Wt 72.6 kg   SpO2 100%   BMI 21.70 kg/m   Physical Exam Vitals signs and nursing note reviewed.  Constitutional:      Appearance: He is well-developed.  HENT:     Head:  Normocephalic and atraumatic.  Eyes:     General: No scleral icterus.    Pupils: Pupils are equal, round, and reactive to light.  Neck:     Musculoskeletal: Normal range of motion.  Cardiovascular:     Heart sounds: Normal heart sounds.  Pulmonary:     Effort: Pulmonary effort is normal.     Breath sounds: Normal breath sounds. No wheezing.  Chest:     Chest wall: No tenderness.  Abdominal:     General: Bowel sounds are normal. There is no distension.     Palpations: Abdomen is soft.     Tenderness: There is no abdominal tenderness.  Musculoskeletal:        General: No tenderness or deformity.     Comments: RLE- KF,KE 5/5 strength LLE- HF, HE 5/5 strength Normal gait. No pronator drift. No leg drop.  Patellar reflexes present and symmetric. CN I, II  and VIII not tested. CN II-XII grossly intact bilaterally.     Skin:    General: Skin is warm and dry.  Neurological:     Mental Status: He is alert and oriented to person, place, and time.      ED Treatments / Results  Labs (all labs ordered are listed, but only abnormal results are displayed) Labs Reviewed - No data to display  EKG None  Radiology Dg Lumbar Spine Complete  Result Date: 01/19/2019 CLINICAL DATA:  Prior L-spine fracture, pain post twisting injury EXAM: LUMBAR SPINE - COMPLETE 4+ VIEW COMPARISON:  Radiograph 02/24/2014, CT abdomen pelvis 02/03/2014 FINDINGS: Five lumbar type vertebral bodies are again seen. There is a stable anterior wedging compression deformity of L1 with approximately 40% anterior height loss similar to comparison radiograph and CT. No acute fracture or vertebral body height loss is evident. No spondylolysis or spondylolisthesis. Minimal facet degenerative changes noted L4-S1. No worrisome osseous lesions. Soft tissues are unremarkable. IMPRESSION: 1. Stable anterior wedging of L1 with approximately 40% anterior height loss. Unchanged since 2015. 2. No acute fracture or vertebral body height loss is evident. 3. Minimal facet degenerative changes at L4-S1. Electronically Signed   By: Kreg ShropshirePrice  DeHay M.D.   On: 01/19/2019 17:37    Procedures Procedures (including critical care time)  Medications Ordered in ED Medications - No data to display   Initial Impression / Assessment and Plan / ED Course  I have reviewed the triage vital signs and the nursing notes.  Pertinent labs & imaging results that were available during my care of the patient were reviewed by me and considered in my medical decision making (see chart for details).    Patient with a past medical history of L1 compression fracture presents to the ED today with complaints of midline low back pain which began a couple days ago was after playing golf.  He reports the pain was exacerbated  with standing up the golf ball.  He has had worsening pain with ambulation, reports is somewhat alleviated with standing up. No improvement with ibuprofen and heat. Patient is concern for his prior compression fracture, will obtain xray to reevaluate his injury.  He does not have a prior history of diabetes, patient will go home on a short course of steroids and muscle relaxers to help with his symptoms.   Xray of Lumbar showed: 1. Stable anterior wedging of L1 with approximately 40% anterior  height loss. Unchanged since 2015.  2. No acute fracture or vertebral body height loss is evident.  3. Minimal facet degenerative changes at  L4-S1.    These results have been discussed with patient at length, I feel that he is appropriate to go home on a short course of steroids, no prior history of diabetes.  He will also get muscle relaxers to help with his symptoms.  Advised to follow-up with Dr. Jordan Likes orthopedist in order to further manage his back pain.  Portions of this note were generated with Scientist, clinical (histocompatibility and immunogenetics). Dictation errors may occur despite best attempts at proofreading.  Final Clinical Impressions(s) / ED Diagnoses   Final diagnoses:  Acute midline low back pain without sciatica    ED Discharge Orders         Ordered    predniSONE (DELTASONE) 20 MG tablet  Daily     01/19/19 1740    methocarbamol (ROBAXIN) 500 MG tablet  2 times daily     01/19/19 1740           Claude Manges, PA-C 01/19/19 1746    Rolan Bucco, MD 01/19/19 1756

## 2020-09-26 IMAGING — DX DG LUMBAR SPINE COMPLETE 4+V
5 series · 5 of 5 positions shown · non-contrast
Comparison: Radiograph 02/24/2014, CT abdomen pelvis 02/03/2014

CLINICAL DATA: Prior L-spine fracture, pain post twisting injury

EXAM:
LUMBAR SPINE - COMPLETE 4+ VIEW

[l-spine ap]
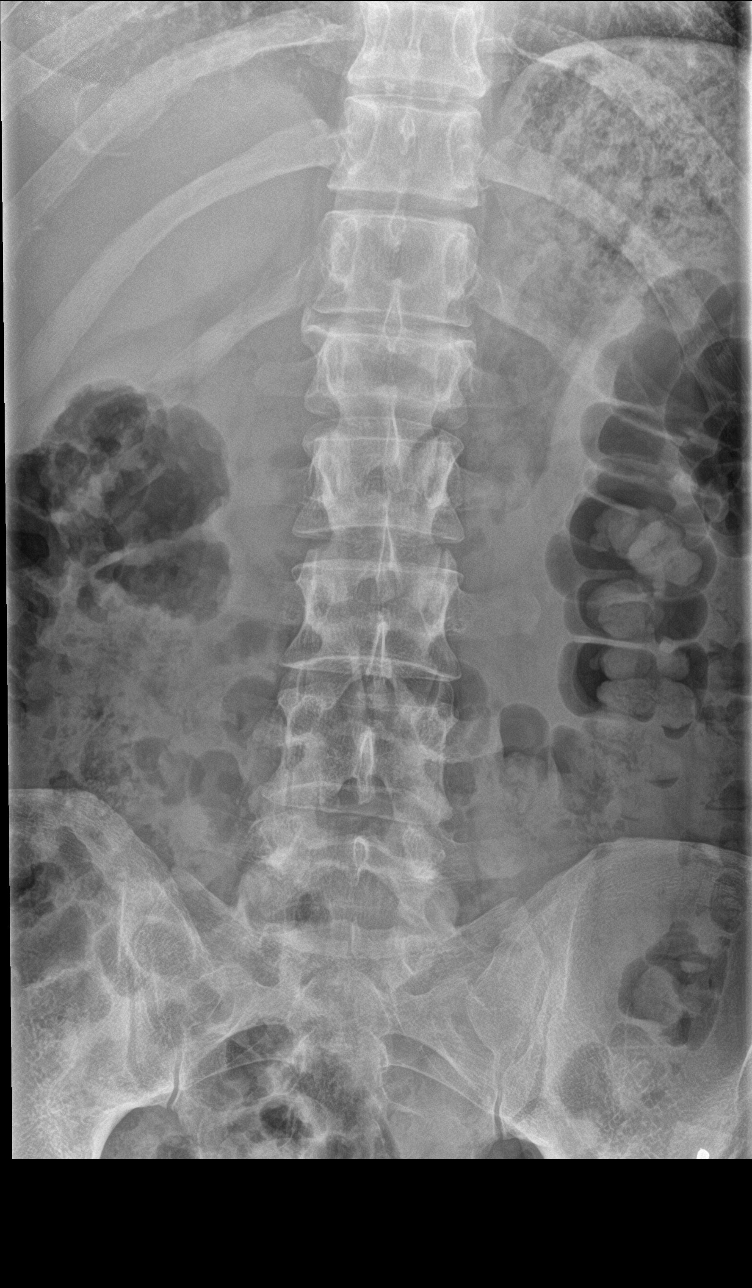

[l-spine obl (1 of 2)]
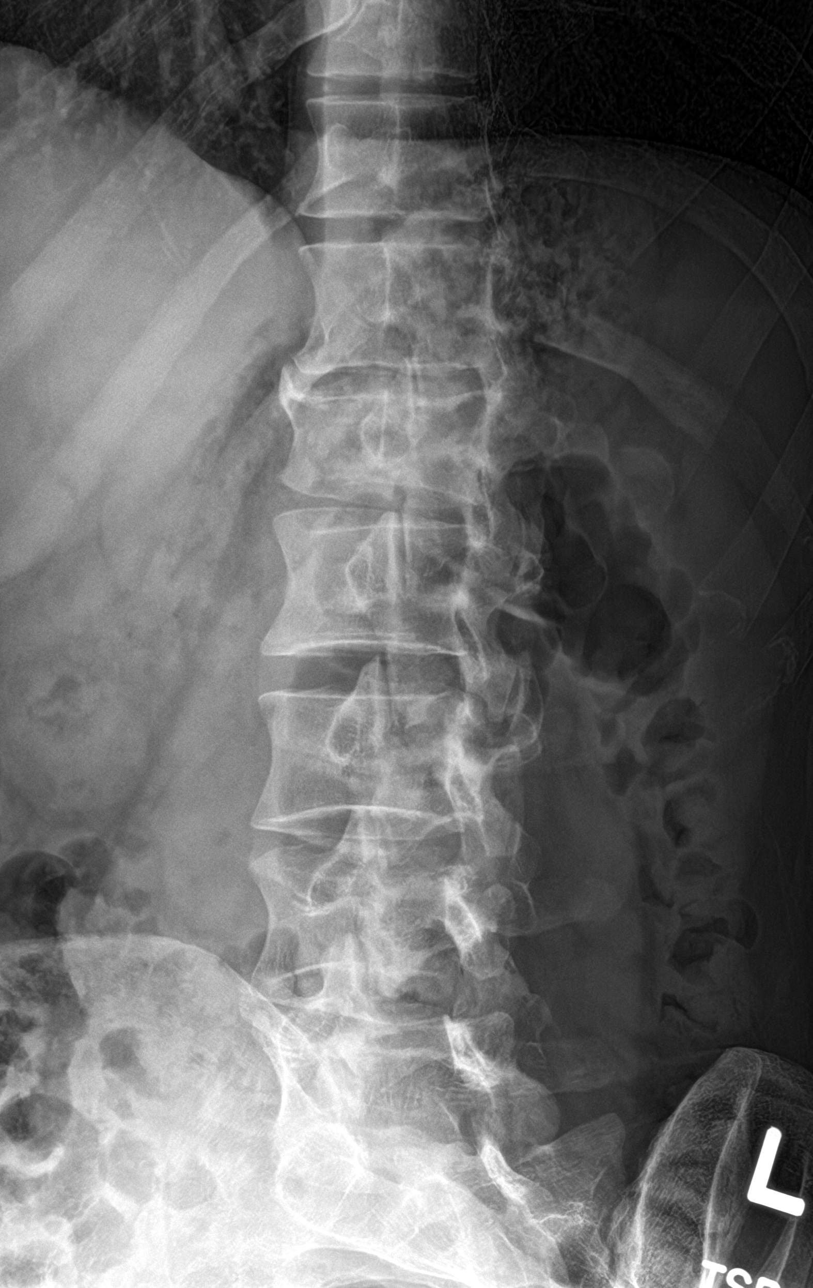

[l-spine lat]
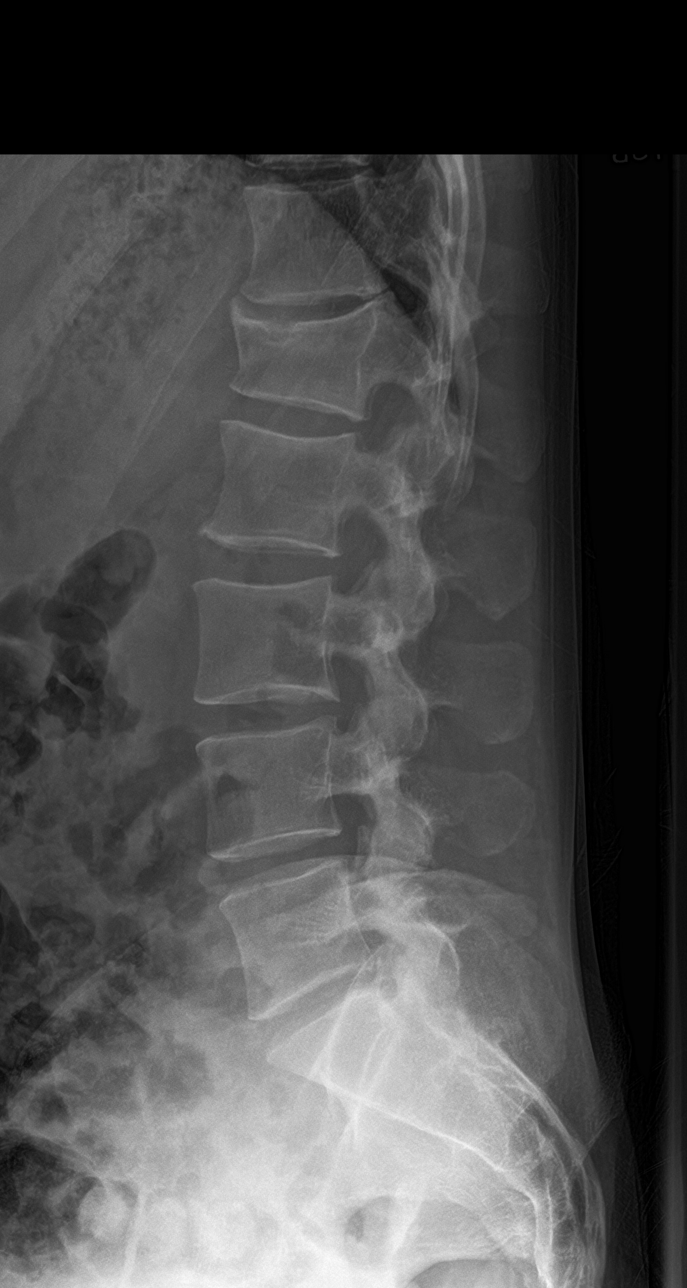

[l-spine spot]
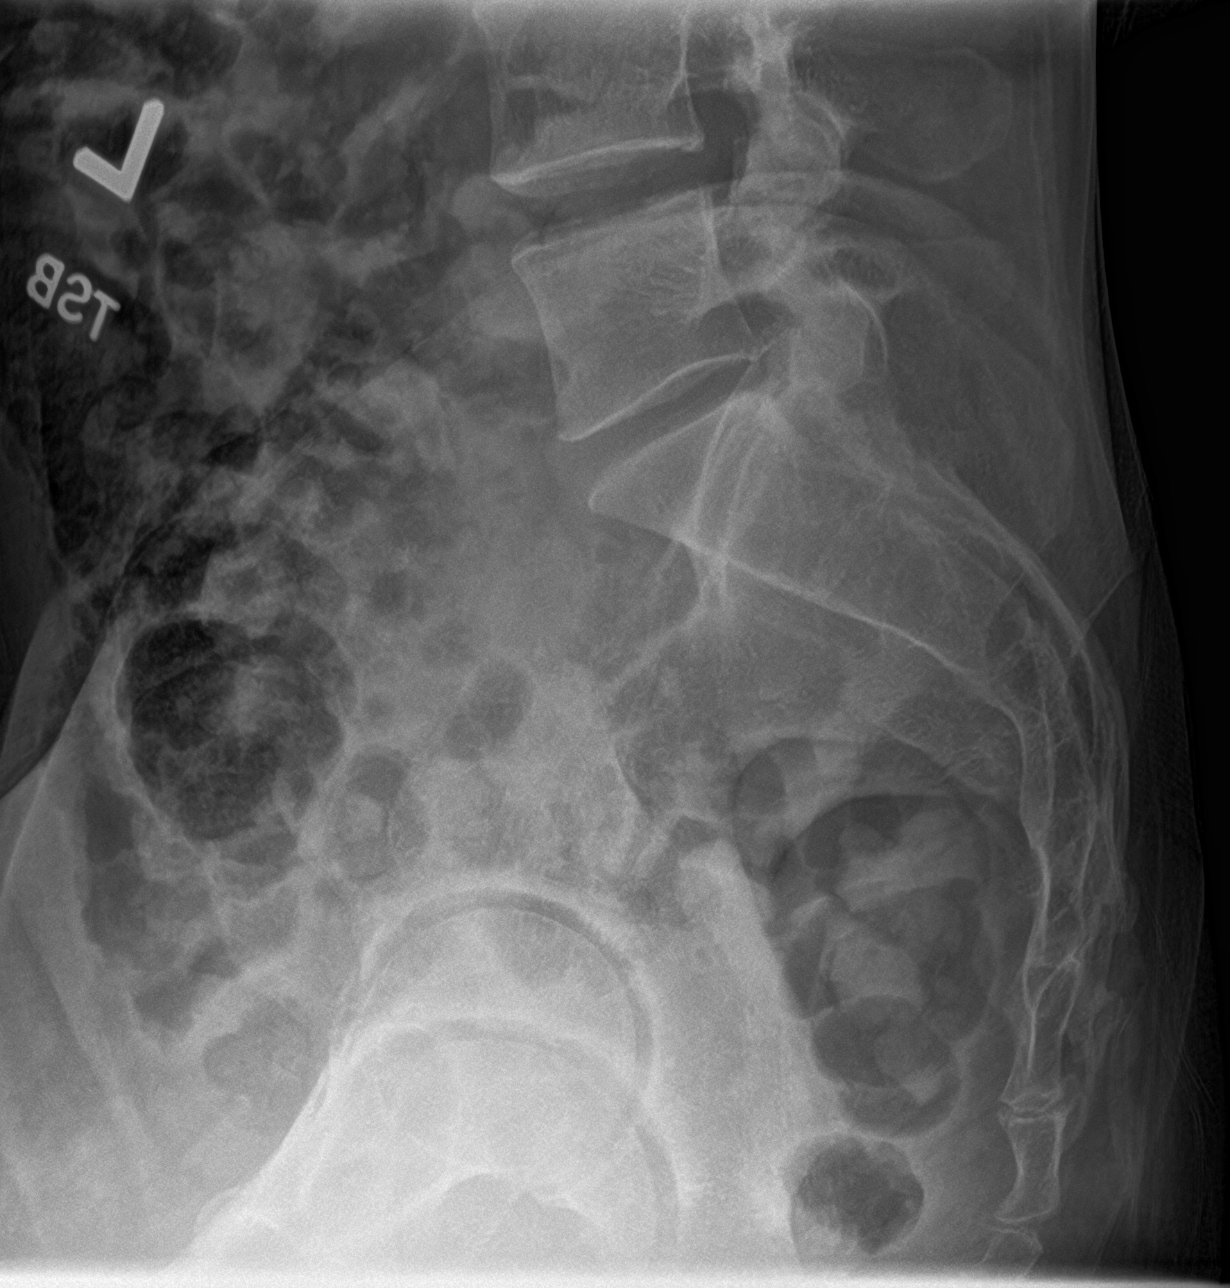

[l-spine obl (2 of 2)]
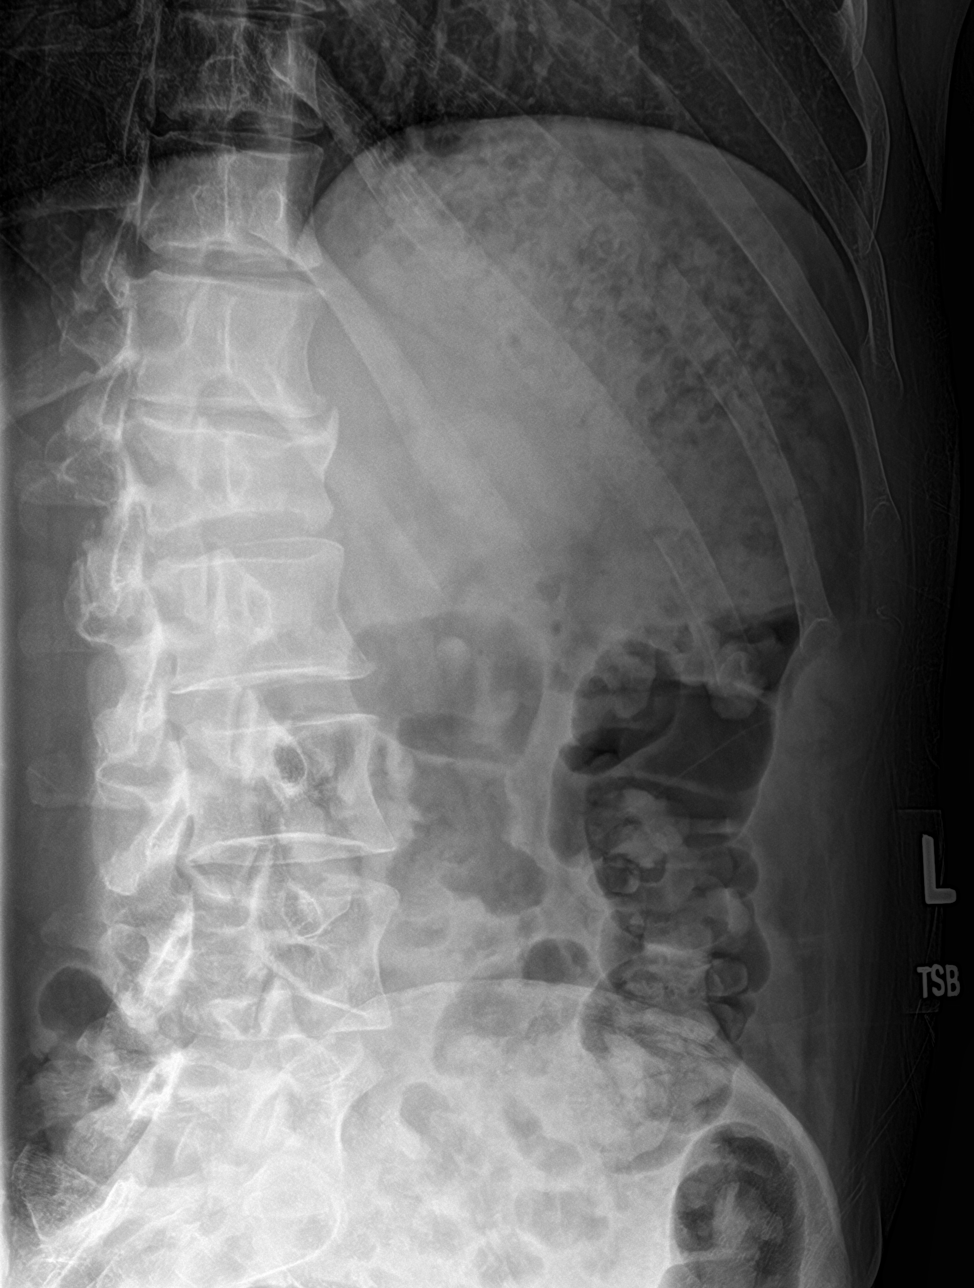

[5 of 5 positions shown; findings below may reference images not displayed]

FINDINGS: Five lumbar type vertebral bodies are again seen. There is a stable
anterior wedging compression deformity of L1 with approximately 40%
anterior height loss similar to comparison radiograph and CT. No
acute fracture or vertebral body height loss is evident. No
spondylolysis or spondylolisthesis. Minimal facet degenerative
changes noted L4-S1. No worrisome osseous lesions. Soft tissues are
unremarkable.
IMPRESSION: 1. Stable anterior wedging of L1 with approximately 40% anterior
height loss. Unchanged since [DATE]. No acute fracture or vertebral body height loss is evident.
3. Minimal facet degenerative changes at L4-S1.

## 2021-09-30 ENCOUNTER — Other Ambulatory Visit: Payer: Self-pay

## 2021-09-30 ENCOUNTER — Emergency Department (HOSPITAL_COMMUNITY)
Admission: EM | Admit: 2021-09-30 | Discharge: 2021-10-02 | Disposition: A | Discharge status: Home / Self Care | Payer: Self-pay | Attending: Emergency Medicine | Admitting: Emergency Medicine

## 2021-09-30 DIAGNOSIS — F1099 Alcohol use, unspecified with unspecified alcohol-induced disorder: Secondary | ICD-10-CM | POA: Insufficient documentation

## 2021-09-30 DIAGNOSIS — Y906 Blood alcohol level of 120-199 mg/100 ml: Secondary | ICD-10-CM | POA: Insufficient documentation

## 2021-09-30 DIAGNOSIS — F1090 Alcohol use, unspecified, uncomplicated: Secondary | ICD-10-CM

## 2021-09-30 DIAGNOSIS — Z20822 Contact with and (suspected) exposure to covid-19: Secondary | ICD-10-CM | POA: Insufficient documentation

## 2021-09-30 DIAGNOSIS — F1014 Alcohol abuse with alcohol-induced mood disorder: Secondary | ICD-10-CM

## 2021-09-30 DIAGNOSIS — Z046 Encounter for general psychiatric examination, requested by authority: Secondary | ICD-10-CM

## 2021-09-30 LAB — ACETAMINOPHEN LEVEL: Acetaminophen (Tylenol), Serum: <10 ug/mL — ABNORMAL LOW (ref 10–30)

## 2021-09-30 LAB — CBC
HCT: 41 % (ref 39.0–52.0)
Hemoglobin: 14.9 g/dL (ref 13.0–17.0)
MCH: 32 pg (ref 26.0–34.0)
MCHC: 36.3 g/dL — ABNORMAL HIGH (ref 30.0–36.0)
MCV: 88.2 fL (ref 80.0–100.0)
Platelets: 346 10*3/uL (ref 150–400)
RBC: 4.65 MIL/uL (ref 4.22–5.81)
RDW: 12.5 % (ref 11.5–15.5)
WBC: 7 10*3/uL (ref 4.0–10.5)
nRBC: 0 % (ref 0.0–0.2)

## 2021-09-30 LAB — COMPREHENSIVE METABOLIC PANEL
ALT: 21 U/L (ref 0–44)
AST: 26 U/L (ref 15–41)
Albumin: 4.4 g/dL (ref 3.5–5.0)
Alkaline Phosphatase: 56 U/L (ref 38–126)
Anion gap: 10 (ref 5–15)
BUN: 8 mg/dL (ref 6–20)
CO2: 25 mmol/L (ref 22–32)
Calcium: 8.9 mg/dL (ref 8.9–10.3)
Chloride: 107 mmol/L (ref 98–111)
Creatinine, Ser: 0.93 mg/dL (ref 0.61–1.24)
GFR, Estimated: >60 mL/min (ref >60)
Glucose, Bld: 88 mg/dL (ref 70–99)
Potassium: 3.7 mmol/L (ref 3.5–5.1)
Sodium: 142 mmol/L (ref 135–145)
Total Bilirubin: 1 mg/dL (ref 0.3–1.2)
Total Protein: 7 g/dL (ref 6.5–8.1)

## 2021-09-30 LAB — ETHANOL: Alcohol, Ethyl (B): 182 mg/dL — ABNORMAL HIGH (ref <10)

## 2021-09-30 LAB — SALICYLATE LEVEL: Salicylate Lvl: <7 mg/dL — ABNORMAL LOW (ref 7.0–30.0)

## 2021-09-30 MED ORDER — LORAZEPAM 1 MG PO TABS
1.0000 mg | ORAL_TABLET | Freq: Once | ORAL | Status: AC
  Administered 2021-09-30: 1 mg via ORAL
  Filled 2021-09-30: qty 1

## 2021-09-30 NOTE — BH Assessment (Signed)
Clinician messaged Monico Hoar. Earlene Plater, RN: "Hey. It's Trey with TTS. Is the pt able to engage in the assessment, if so the pt will need to be placed in a private room. Is the pt under IVC? Also is the pt medically cleared? Can you fax pt's IVC paperwork to [phone]."   Clinician awaiting response.    Redmond Pulling, MS, [facility], [facility] Triage Specialist [phone]
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
## 2021-09-30 NOTE — ED Provider Notes (Signed)
[facility] EMERGENCY DEPARTMENT Provider Note   CSN: [id number] Arrival date & time: 09/30/21  2112     History {Add pertinent medical, surgical, social history, OB history to HPI:1} Chief Complaint  Patient presents with   IVC    Timothy Moses is a 36 y.o. male.  36 year old male presents to the emergency department under IVC taken out by wife.  Patient does endorse making a comment to his wife about not wanting to be alive.  Reports that he has been very stressed lately and there has been a lot going on with his children.  Has a history of alcohol abuse and dependence.  Was sober up until COVID when he resumed drinking again.  Has been binge drinking over the weekend.  Believes he had approximately 1/5 of liquor today.  Presently denies any suicidal or homicidal thoughts.  Does have a history of suicide attempt by overdose 7 years ago.  The history is provided by the patient. No language interpreter was used.       Home Medications Prior to Admission medications   Not on File      Allergies    Patient has no known allergies.    Review of Systems   Review of Systems Ten systems reviewed and are negative for acute change, except as noted in the HPI.    Physical Exam Updated Vital Signs BP (!) 150/109 (BP Location: Right Arm)   Pulse 81   Temp 97.9 F (36.6 C)   Resp 18   SpO2 98%   Physical Exam Vitals and nursing note reviewed.  Constitutional:      General: He is not in acute distress.    Appearance: He is well-developed. He is not diaphoretic.  HENT:     Head: Normocephalic and atraumatic.  Eyes:     General: No scleral icterus.    Conjunctiva/sclera: Conjunctivae normal.  Pulmonary:     Effort: Pulmonary effort is normal. No respiratory distress.     Comments: Respirations even and unlabored Musculoskeletal:        General: Normal range of motion.     Cervical back: Normal range of motion.  Skin:    General: Skin is warm and dry.      Coloration: Skin is not pale.     Findings: No erythema or rash.  Neurological:     Mental Status: He is alert and oriented to person, place, and time.  Psychiatric:        Behavior: Behavior normal.     Comments: No SI/HI     ED Results / Procedures / Treatments   Labs (all labs ordered are listed, but only abnormal results are displayed) Labs Reviewed  ETHANOL - Abnormal; Notable for the following components:      Result Value   Alcohol, Ethyl (B) 182 (*)    All other components within normal limits  SALICYLATE LEVEL - Abnormal; Notable for the following components:   Salicylate Lvl <7.0 (*)    All other components within normal limits  ACETAMINOPHEN LEVEL - Abnormal; Notable for the following components:   Acetaminophen (Tylenol), Serum <10 (*)    All other components within normal limits  CBC - Abnormal; Notable for the following components:   MCHC 36.3 (*)    All other components within normal limits  RESP PANEL BY RT-PCR (FLU A&B, COVID) ARPGX2  COMPREHENSIVE METABOLIC PANEL  RAPID URINE DRUG SCREEN, HOSP PERFORMED    EKG None  Radiology No results  found.  Procedures Procedures  {Document cardiac monitor, telemetry assessment procedure when appropriate:1}  Medications Ordered in ED Medications  LORazepam (ATIVAN) tablet 1 mg (has no administration in time range)    ED Course/ Medical Decision Making/ A&P                           Medical Decision Making Risk Prescription drug management.   ***  {Document critical care time when appropriate:1} {Document review of labs and clinical decision tools ie heart score, Chads2Vasc2 etc:1}  {Document your independent review of radiology images, and any outside records:1} {Document your discussion with family members, caretakers, and with consultants:1} {Document social determinants of health affecting pt's care:1} {Document your decision making why or why not admission, treatments were needed:1} Final  Clinical Impression(s) / ED Diagnoses Final diagnoses:  None    Rx / DC Orders ED Discharge Orders     None

## 2021-09-30 NOTE — ED Notes (Signed)
Pt denies SI/HI, hallucinations in triage. Pt reports to drinking alcohol approx 6 hours ago and smoking weed. Pt denies any other drug use.

## 2021-10-01 DIAGNOSIS — F1014 Alcohol abuse with alcohol-induced mood disorder: Secondary | ICD-10-CM

## 2021-10-01 LAB — RAPID URINE DRUG SCREEN, HOSP PERFORMED
Amphetamines: NOT DETECTED
Barbiturates: NOT DETECTED
Benzodiazepines: NOT DETECTED
Cocaine: NOT DETECTED
Opiates: NOT DETECTED
Tetrahydrocannabinol: POSITIVE — AB

## 2021-10-01 LAB — RESP PANEL BY RT-PCR (FLU A&B, COVID) ARPGX2
Influenza A by PCR: NEGATIVE
Influenza B by PCR: NEGATIVE
SARS Coronavirus 2 by RT PCR: NEGATIVE

## 2021-10-01 MED ORDER — SERTRALINE HCL 50 MG PO TABS
25.0000 mg | ORAL_TABLET | Freq: Every day | ORAL | Status: DC
  Administered 2021-10-01 – 2021-10-02 (×2): 25 mg via ORAL
  Filled 2021-10-01 (×2): qty 1

## 2021-10-01 MED ORDER — IBUPROFEN 400 MG PO TABS
600.0000 mg | ORAL_TABLET | Freq: Once | ORAL | Status: AC
  Administered 2021-10-01: 600 mg via ORAL
  Filled 2021-10-01: qty 1

## 2021-10-01 MED ORDER — TRAZODONE HCL 50 MG PO TABS: 50.0000 mg | ORAL_TABLET | Freq: Every evening | ORAL | Status: DC | PRN

## 2021-10-01 MED ORDER — HYDROXYZINE HCL 25 MG PO TABS
25.0000 mg | ORAL_TABLET | Freq: Three times a day (TID) | ORAL | Status: DC | PRN
  Administered 2021-10-01: 25 mg via ORAL
  Filled 2021-10-01: qty 1

## 2021-10-01 MED ORDER — NICOTINE 21 MG/24HR TD PT24
21.0000 mg | MEDICATED_PATCH | Freq: Every day | TRANSDERMAL | Status: DC
  Administered 2021-10-01 – 2021-10-02 (×2): 21 mg via TRANSDERMAL
  Filled 2021-10-01 (×2): qty 1

## 2021-10-01 NOTE — ED Notes (Signed)
Resting/ sleeping under blanket, repositions self, NAD, calm, rise and fall of chest noted, sitter present.

## 2021-10-01 NOTE — ED Notes (Addendum)
C/o leg cramps, EDP notified.

## 2021-10-01 NOTE — ED Notes (Signed)
Breakfast order placed ?

## 2021-10-01 NOTE — ED Notes (Signed)
Pt appears to be sleeping, observed even RR and unlabored, blanket on pt for warmth and comfort, lights off to room to help induce sleep, NAD noted, room secured, plan of care on going, no further concerns as of present 

## 2021-10-01 NOTE — ED Notes (Signed)
TTS in process 
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
## 2021-10-01 NOTE — BH Assessment (Addendum)
Per Eligha Bridegroom, NP, patient does meet criteria for IVC and inpatient psychiatric treatment.   [facility] has been notified of need for inpatient admission. However, no beds available per [facility] Debbe Bales, RN). Referral faxed to the following hospitals for consideration of inpatient psychiatric treatment.     [facility]   N/A [address], [place] [address] [phone] --  [facility]   N/A [address] [place] [address] [phone] --  [facility]   N/A [address] [place], [place] [address] [phone] --  [facility]   N/A [address], [place] [address] [phone] --  [facility]   N/A [address] [place] [address] [phone] --  [facility]   N/A [address] [place], [place] [address] [phone] --  [facility]   N/A [phone] [place] [address] [phone] --  [facility]   N/A [address] [place], [place] [address] [phone] --  [facility]   N/A [address] [place] [address] [phone] --  [facility]   N/A [address], [place] [address] [phone] --  [facility]   N/A [address] [place] [address] [phone] --  [facility]   N/A [address] [place] [address] [phone] --  [facility]   N/A [address], [place] [address] [phone] --  [facility]   N/A [address], [place] [address] [phone] --  [facility]   N/A [address] [place] [address] [phone] --  [facility]   N/A [address], [place] [address] [phone] --  [facility]   N/A [address], [place] [address] [phone] --  [facility]   N/A [address] [place] [address] [phone] --  [facility]   N/A [address] [place] [address] [phone] --  [facility]   N/A [address] [place] [address] [phone] --  [facility]   N/A [address] [place] [address] [phone] --  [facility]   N/A [address] [place] [address] [phone] --  [facility]

## 2021-10-01 NOTE — ED Notes (Signed)
Psych at [facility]. Pt alert, NAD, calm, cooperative, interactive.

## 2021-10-01 NOTE — ED Notes (Signed)
Pt resting and watching TV, observed even RR and unlabored, blanket on pt for warmth and comfort, lights off to room to help induce sleep, NAD noted, room secured, plan of care on going, no further concerns as of present 
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
## 2021-10-02 ENCOUNTER — Other Ambulatory Visit: Payer: Self-pay

## 2021-10-02 ENCOUNTER — Encounter: Payer: Self-pay | Admitting: Psychiatry

## 2021-10-02 ENCOUNTER — Inpatient Hospital Stay
Admission: AD | Admit: 2021-10-02 | Discharge: 2021-10-04 | DRG: 897 | Disposition: A | Discharge status: Home / Self Care | Payer: 59 | Source: Intra-hospital | Attending: Psychiatry | Admitting: Psychiatry

## 2021-10-02 DIAGNOSIS — F1994 Other psychoactive substance use, unspecified with psychoactive substance-induced mood disorder: Secondary | ICD-10-CM | POA: Diagnosis present

## 2021-10-02 DIAGNOSIS — Z9151 Personal history of suicidal behavior: Secondary | ICD-10-CM

## 2021-10-02 DIAGNOSIS — F1010 Alcohol abuse, uncomplicated: Principal | ICD-10-CM | POA: Diagnosis present

## 2021-10-02 DIAGNOSIS — F332 Major depressive disorder, recurrent severe without psychotic features: Secondary | ICD-10-CM | POA: Diagnosis present

## 2021-10-02 DIAGNOSIS — R45851 Suicidal ideations: Secondary | ICD-10-CM | POA: Diagnosis present

## 2021-10-02 DIAGNOSIS — Z87891 Personal history of nicotine dependence: Secondary | ICD-10-CM | POA: Diagnosis not present

## 2021-10-02 MED ORDER — TRAZODONE HCL 50 MG PO TABS
50.0000 mg | ORAL_TABLET | Freq: Every evening | ORAL | Status: DC | PRN
  Administered 2021-10-02 – 2021-10-03 (×2): 50 mg via ORAL
  Filled 2021-10-02 (×2): qty 1

## 2021-10-02 MED ORDER — NICOTINE 21 MG/24HR TD PT24: 21.0000 mg | MEDICATED_PATCH | Freq: Every day | TRANSDERMAL | Status: DC

## 2021-10-02 MED ORDER — ACETAMINOPHEN 325 MG PO TABS
650.0000 mg | ORAL_TABLET | Freq: Four times a day (QID) | ORAL | Status: DC | PRN
Start: 2021-10-02 — End: 2021-10-04

## 2021-10-02 MED ORDER — ALUM & MAG HYDROXIDE-SIMETH 200-200-20 MG/5ML PO SUSP: 30.0000 mL | ORAL | Status: DC | PRN

## 2021-10-02 MED ORDER — SERTRALINE HCL 25 MG PO TABS
25.0000 mg | ORAL_TABLET | Freq: Every day | ORAL | Status: DC
  Administered 2021-10-03: 25 mg via ORAL
  Filled 2021-10-02: qty 1

## 2021-10-02 MED ORDER — HYDROXYZINE HCL 25 MG PO TABS
25.0000 mg | ORAL_TABLET | Freq: Three times a day (TID) | ORAL | Status: DC | PRN
  Administered 2021-10-03 – 2021-10-04 (×2): 25 mg via ORAL
  Filled 2021-10-02 (×2): qty 1

## 2021-10-02 MED ORDER — NICOTINE 21 MG/24HR TD PT24
21.0000 mg | MEDICATED_PATCH | Freq: Every day | TRANSDERMAL | Status: DC
  Administered 2021-10-02 – 2021-10-03 (×2): 21 mg via TRANSDERMAL
  Filled 2021-10-02 (×3): qty 1

## 2021-10-02 MED ORDER — MAGNESIUM HYDROXIDE 400 MG/5ML PO SUSP: 30.0000 mL | Freq: Every day | ORAL | Status: DC | PRN

## 2021-10-02 NOTE — ED Notes (Signed)
Pt continues to sleep, observed even RR and unlabored, blanket on pt for warmth and comfort, lights off to room to help induce sleep, NAD noted, sitter is monitoring pt at this time for safety, room secured, plan of care on going, no further concerns as of present 

## 2021-10-02 NOTE — ED Notes (Signed)
Lunch order placed

## 2021-10-02 NOTE — Group Note (Signed)
[facility] LCSW Group Therapy Note   Group Date: 10/02/2021 Start Time: 1300 End Time: 1400  Type of Therapy/Topic:  Group Therapy:  Feelings about Diagnosis  Participation Level:  Did Not Attend    Description of Group:    This group will allow patients to explore their thoughts and feelings about diagnoses they have received. Patients will be guided to explore their level of understanding and acceptance of these diagnoses. Facilitator will encourage patients to process their thoughts and feelings about the reactions of others to their diagnosis, and will guide patients in identifying ways to discuss their diagnosis with significant others in their lives. This group will be process-oriented, with patients participating in exploration of their own experiences as well as giving and receiving support and challenge from other group members.   Therapeutic Goals: 1. Patient will demonstrate understanding of diagnosis as evidence by identifying two or more symptoms of the disorder:  2. Patient will be able to express two feelings regarding the diagnosis 3. Patient will demonstrate ability to communicate their needs through discussion and/or role plays  Summary of Patient Progress: X   Therapeutic Modalities:   Cognitive Behavioral Therapy Brief Therapy Feelings Identification    Emmilee Reamer R Montrell Cessna, LCSW 

## 2021-10-02 NOTE — BH Assessment (Signed)
Patient is to be admitted to [facility] by Dr. Toni Amend.  Attending Physician will be Dr.  Toni Amend .   Patient has been assigned to room 310, by [facility] Charge Nurse, Annelisa Ryback.    **Call report to: [phone] **  ER staff is aware of the admission:   Sue Lush, Patient Access.

## 2021-10-02 NOTE — ED Notes (Signed)
Report attempted to [facility], nurses not available at current time. Left number for call back and will attempt to call back.

## 2021-10-02 NOTE — ED Notes (Signed)
Pt walked to the BR w/o any dificulty, back in room.

## 2021-10-02 NOTE — ED Provider Notes (Signed)
Received care of patient from previous providers.  Please see her note for prior history, physical and care.  With this is a 36 year old male who presented with IVC from wife after he made comments that he did not want to be alive anymore.  No other acute medical concerns.  He is currently awaiting placement.   Alvira Monday, MD 10/02/21 [phone]

## 2021-10-02 NOTE — Tx Team (Signed)
Initial Treatment Plan 10/02/2021 2:52 PM Timothy Moses HEN:[id number]    PATIENT STRESSORS: Marital or family conflict   Substance abuse     PATIENT STRENGTHS: Ability for insight  Active sense of humor  Average or above average intelligence  Capable of independent living  Communication skills  Financial means  General fund of knowledge  Physical Health  Special hobby/interest  Supportive family/friends  Work skills    PATIENT IDENTIFIED PROBLEMS: Suicidal ideations 10/02/2021  Substance Abuse 10/02/2021                   DISCHARGE CRITERIA:  Motivation to continue treatment in a less acute level of care Need for constant or close observation no longer present  PRELIMINARY DISCHARGE PLAN: Attend 12-step recovery group Outpatient therapy Return to previous living arrangement Return to previous work or school arrangements  PATIENT/FAMILY INVOLVEMENT: This treatment plan has been presented to and reviewed with the patient, Timothy Moses.  The patient has been given the opportunity to ask questions and make suggestions.  Lenox Ponds, RN 10/02/2021, 2:52 PM

## 2021-10-02 NOTE — Progress Notes (Addendum)
Received patient from [facility] Emergency Department. Patient skin assessment completed with Demetria, RN, skin was intact, no contraband found with all unit prohibited items locked and stored away for discharge. Pt. Was admitted under the services of, Dr. Toni Amend.  Patient oriented to unit/room/call light. Pt. Given extensive admissions education. Patient was encourage to participate in unit activities and continue with plan of care being put into place. Q x 15 minute observation checks were initiated for safety.   Patient was receptive to treatment plan being put into place and safety to be maintained on unit per MD orders.   Patient endorsed prior to admissions that, "I said some stupid stuff while drinking over text about not wanting to be here and my wife IVC'd me." Patient endorsed that he has of recently started drinking again and is no longer "sober" and that he feels bad about this, describing it has caused nothing but problems. Patient endorsed despite comments made over text, he no longer has those feelings of wanting to not be around, describing that "it was a big mistake" to have said what he said while drinking. Patient endorsed he can remain safe on the unit and has not had any thoughts of harming himself or others since the incident over text message with his wife. Patient endorsed no physical pain and has been eating and sleeping normally. Patient endorsed that he is eager to discharge, but is concerned if he discharges too soon, his wife won't believe that he left under normal conditions, stating that his wife believes he will lie to get himself discharged.

## 2021-10-02 NOTE — ED Notes (Signed)
Pt currently talking on the phone w/ his wife

## 2021-10-02 NOTE — Plan of Care (Deleted)
Care Plan Initiated   Problem: Education: Goal: Knowledge of General Education information will improve Description: Including pain rating scale, medication(s)/side effects and non-pharmacologic comfort measures Outcome: Not Applicable   Problem: Health Behavior/Discharge Planning: Goal: Ability to manage health-related needs will improve Outcome: Not Applicable   Problem: Clinical Measurements: Goal: Ability to maintain clinical measurements within normal limits will improve Outcome: Not Applicable Goal: Will remain free from infection Outcome: Not Applicable Goal: Diagnostic test results will improve Outcome: Not Applicable Goal: Respiratory complications will improve Outcome: Not Applicable Goal: Cardiovascular complication will be avoided Outcome: Not Applicable   Problem: Activity: Goal: Risk for activity intolerance will decrease Outcome: Not Applicable   Problem: Nutrition: Goal: Adequate nutrition will be maintained Outcome: Not Applicable   Problem: Coping: Goal: Level of anxiety will decrease Outcome: Not Applicable   Problem: Elimination: Goal: Will not experience complications related to bowel motility Outcome: Not Applicable Goal: Will not experience complications related to urinary retention Outcome: Not Applicable   Problem: Pain Managment: Goal: General experience of comfort will improve Outcome: Not Applicable   Problem: Safety: Goal: Ability to remain free from injury will improve Outcome: Not Applicable   Problem: Skin Integrity: Goal: Risk for impaired skin integrity will decrease Outcome: Not Applicable   Problem: Coping: Goal: Coping ability will improve Outcome: Progressing   Problem: Medication: Goal: Compliance with prescribed medication regimen will improve Outcome: Progressing

## 2021-10-02 NOTE — ED Notes (Signed)
Psychiatry talking to pt 

## 2021-10-02 NOTE — ED Notes (Signed)
[employer] Deputy Graves arrived for transport of pt, all pt belongings and paperwork given to deputy.

## 2021-10-02 NOTE — ED Notes (Signed)
Pt appears to be sleeping, observed even RR and unlabored, blanket on pt for warmth and comfort, lights off to room to help induce sleep, sitter at the bedside, door open for safety observation, NAD noted, room secured, plan of care on going, no further concerns as of present

## 2021-10-02 NOTE — BH Assessment (Addendum)
Patient accepted to [facility] by Dr. Toni Amend.  Patient's room assignment is 310. Nurse report [phone]. Sheriff to provide patient's transport to [facility]. Updates provided to nursing staff Delice Bison, RN) and the [facility] provider Glynda Jaeger, NP)

## 2021-10-02 NOTE — ED Notes (Signed)
Breakfast order placed ?

## 2021-10-03 ENCOUNTER — Other Ambulatory Visit: Payer: Self-pay

## 2021-10-03 DIAGNOSIS — F1010 Alcohol abuse, uncomplicated: Secondary | ICD-10-CM | POA: Diagnosis not present

## 2021-10-03 MED ORDER — SERTRALINE HCL 50 MG PO TABS
50.0000 mg | ORAL_TABLET | Freq: Every day | ORAL | 0 refills | Status: AC
  Filled 2021-10-03: qty 30, 30d supply, fill #0

## 2021-10-03 MED ORDER — TRAZODONE HCL 50 MG PO TABS
50.0000 mg | ORAL_TABLET | Freq: Every evening | ORAL | 1 refills | Status: DC | PRN
Start: 2021-10-03 — End: 2021-10-03

## 2021-10-03 MED ORDER — NICOTINE 21 MG/24HR TD PT24
21.0000 mg | MEDICATED_PATCH | Freq: Every day | TRANSDERMAL | 0 refills | Status: AC
Start: 2021-10-04 — End: ?
  Filled 2021-10-03: qty 28, 28d supply, fill #0

## 2021-10-03 MED ORDER — HYDROXYZINE HCL 25 MG PO TABS
25.0000 mg | ORAL_TABLET | Freq: Three times a day (TID) | ORAL | 1 refills | Status: DC | PRN
Start: 2021-10-03 — End: 2021-10-03

## 2021-10-03 MED ORDER — NICOTINE 21 MG/24HR TD PT24
21.0000 mg | MEDICATED_PATCH | Freq: Every day | TRANSDERMAL | 1 refills | Status: DC
Start: 2021-10-04 — End: 2021-10-03

## 2021-10-03 MED ORDER — SERTRALINE HCL 50 MG PO TABS
50.0000 mg | ORAL_TABLET | Freq: Every day | ORAL | 1 refills | Status: DC
Start: 2021-10-04 — End: 2021-10-03

## 2021-10-03 MED ORDER — TRAZODONE HCL 50 MG PO TABS
50.0000 mg | ORAL_TABLET | Freq: Every evening | ORAL | 0 refills | Status: AC | PRN
Start: 2021-10-03 — End: ?
  Filled 2021-10-03: qty 30, 30d supply, fill #0

## 2021-10-03 MED ORDER — HYDROXYZINE HCL 25 MG PO TABS
25.0000 mg | ORAL_TABLET | Freq: Three times a day (TID) | ORAL | 1 refills | Status: AC | PRN
  Filled 2021-10-03: qty 30, 10d supply, fill #0

## 2021-10-03 MED ORDER — SERTRALINE HCL 25 MG PO TABS
50.0000 mg | ORAL_TABLET | Freq: Every day | ORAL | Status: DC
Start: 2021-10-04 — End: 2021-10-04
  Administered 2021-10-04: 50 mg via ORAL
  Filled 2021-10-03: qty 2

## 2021-10-03 NOTE — Plan of Care (Signed)
  Problem: Coping: Goal: Coping ability will improve Outcome: Progressing   Problem: Medication: Goal: Compliance with prescribed medication regimen will improve Outcome: Progressing   Problem: Self-Concept: Goal: Ability to disclose and discuss suicidal ideas will improve Outcome: Progressing Goal: Will verbalize positive feelings about self Outcome: Progressing   Problem: Safety: Goal: Ability to remain free from injury will improve Outcome: Progressing

## 2021-10-03 NOTE — Plan of Care (Signed)
Patient verbalized insight of his problems and desire to improve his life. Patient states that his wife and her family is supportive. Patient appropriate with staff & peers. Denies SI,HI and AVH. Appetite and energy level good. Support and encouragement given.

## 2021-10-03 NOTE — H&P (Signed)
Psychiatric Admission Assessment Adult  Patient Identification: Timothy Moses MRN:  [id number] Date of Evaluation:  10/03/2021 Chief Complaint:  Alcohol abuse [F10.10] Principal Diagnosis: Alcohol abuse Diagnosis:  Principal Problem:   Alcohol abuse Active Problems:   Depression, major, severe recurrence ([place])   Substance induced mood disorder ([place])  History of Present Illness: Patient seen and chart reviewed.  36 year old man with a history of alcohol abuse brought to the emergency room in [place] after drinking heavily all weekend and then making a suicidal statement to his wife.  Patient admits this saying that he and his wife had been arguing recently and that she left him by himself all weekend.  He admits that he drank heavily all weekend and that when the argument continued Sunday night he made suicidal statements.  He did not make any attempt to kill himself and says he now denies having any active suicidal thoughts.  Nevertheless he has insight that his drinking was a problem and does admit that his mood recently has been worse with more irritability and depression.  Patient is currently sober not having alcohol withdrawal symptoms.  Behavior has been calm and appropriate here.  He is not currently involved in any outpatient mental health treatment.  Also reports that while sertraline had been helpful for anger and depression in the past he had been off it for about a year recently.  Denies that he has been abusing other drugs regularly Associated Signs/Symptoms: Depression Symptoms:  suicidal thoughts without plan, anxiety, Duration of Depression Symptoms: No data recorded (Hypo) Manic Symptoms:  Irritable Mood, Anxiety Symptoms:  Excessive Worry, Psychotic Symptoms:   Denies any PTSD Symptoms: Negative Total Time spent with patient: 45 minutes  Past Psychiatric History: Patient has a past history of substance abuse.  1 previous hospitalization in 2016 when he was abusing  multiple drugs and alcohol.  Had an overdose attempt at that time.  He says that after that episode he engaged heavily in substance abuse treatment and was doing very well for the last few years but has continued to have binge drinking episodes.  Has been prescribed sertraline in the past and says it has always seem to help him a lot with anxiety and anger  Is the patient at risk to self? Yes.    Has the patient been a risk to self in the past 6 months? Yes.    Has the patient been a risk to self within the distant past? Yes.    Is the patient a risk to others? Yes.    Has the patient been a risk to others in the past 6 months? Yes.    Has the patient been a risk to others within the distant past? No.   [place] Scale:  [place] Admission (Current) from 10/02/2021 in [place] ED from 09/30/2021 in [place] High Risk No Risk        Prior Inpatient Therapy:   Prior Outpatient Therapy:    Alcohol Screening: 1. How often do you have a drink containing alcohol?: 2 to 3 times a week 2. How many drinks containing alcohol do you have on a typical day when you are drinking?: 3 or 4 3. How often do you have six or more drinks on one occasion?: Weekly AUDIT-C Score: 7 4. How often during the last year have you found that you were not able to stop drinking once you had started?: Weekly 5. How  often during the last year have you failed to do what was normally expected from you because of drinking?: Weekly 6. How often during the last year have you needed a first drink in the morning to get yourself going after a heavy drinking session?: Less than monthly 7. How often during the last year have you had a feeling of guilt of remorse after drinking?: Weekly 8. How often during the last year have you been unable to remember what happened the night before because you had been drinking?: Monthly 9. Have you or someone  else been injured as a result of your drinking?: Yes, during the last year 10. Has a relative or friend or a doctor or another health worker been concerned about your drinking or suggested you cut down?: Yes, during the last year Alcohol Use Disorder Identification Test Final Score (AUDIT): 27 Alcohol Brief Interventions/Follow-up: Alcohol education/Brief advice Substance Abuse History in the last 12 months:  Yes.   Consequences of Substance Abuse: Current episode with repeated anger and impulsive behavior hostility and suicidal ideation all seem to be tied to heavy alcohol abuse Previous Psychotropic Medications: Yes  Psychological Evaluations: Yes  Past Medical History:  Past Medical History:  Diagnosis Date   ADHD (attention deficit hyperactivity disorder)    Anxiety    Depression    History reviewed. No pertinent surgical history. Family History:  Family History  Problem Relation Age of Onset   COPD Mother    Cancer Mother        Ovarian   Anuerysm Father        Brain x2   Family Psychiatric  History: None reported Tobacco Screening:   Social History:  Social History   Substance and Sexual Activity  Alcohol Use Yes     Social History   Substance and Sexual Activity  Drug Use No    Additional Social History: Marital status: Married Number of Years Married: 1.6 ("Together since February 2017, married October 2021.") What types of issues is patient dealing with in the relationship?: Pt's drinking is causing issues. Additional relationship information: They met in treatment per pt. "Love my wife to death and my life with her." Are you sexually active?: Yes What is your sexual orientation?: "Straight" Has your sexual activity been affected by drugs, alcohol, medication, or emotional stress?: Not assessed Does patient have children?: Yes How many children?: 2 (70yo stepson and 11yo stepdaughter.) How is patient's relationship with their children?: "I think it's really  good." He shares that his stepson wrote him a letter telling him that he loves him and he just wants him to stop drinking.                         Allergies:  No Known Allergies Lab Results: No results found for this or any previous visit (from the past 48 hour(s)).  Blood Alcohol level:  Lab Results  Component Value Date   ETH 182 (H) 09/30/2021   ETH 222 (H) 32/67/1245    Metabolic Disorder Labs:  No results found for: "HGBA1C", "MPG" No results found for: "PROLACTIN" No results found for: "CHOL", "TRIG", "HDL", "CHOLHDL", "VLDL", "LDLCALC"  Current Medications: Current Facility-Administered Medications  Medication Dose Route Frequency Provider Last Rate Last Admin   acetaminophen (TYLENOL) tablet 650 mg  650 mg Oral Q6H PRN Cacey Willow T, MD       alum & mag hydroxide-simeth (MAALOX/MYLANTA) 200-200-20 MG/5ML suspension 30 mL  30 mL Oral Q4H  PRN Jaramiah Bossard, Madie Reno, MD       hydrOXYzine (ATARAX) tablet 25 mg  25 mg Oral TID PRN Kasin Tonkinson, Madie Reno, MD       magnesium hydroxide (MILK OF MAGNESIA) suspension 30 mL  30 mL Oral Daily PRN Lielle Vandervort, Madie Reno, MD       nicotine (NICODERM CQ - dosed in mg/24 hours) patch 21 mg  21 mg Transdermal Daily Benita Gutter, RPH   21 mg at 10/03/21 0835   [START ON 10/04/2021] sertraline (ZOLOFT) tablet 50 mg  50 mg Oral Daily Lanina Larranaga, Madie Reno, MD       traZODone (DESYREL) tablet 50 mg  50 mg Oral QHS PRN Dulce Martian, Madie Reno, MD   50 mg at 10/02/21 2124   PTA Medications: Medications Prior to Admission  Medication Sig Dispense Refill Last Dose   acetaminophen (TYLENOL) 500 MG tablet Take 1,000 mg by mouth 2 (two) times daily as needed for mild pain or headache.      OVER THE COUNTER MEDICATION Take 1 capsule by mouth daily. Alpha Brain       Musculoskeletal: Strength & Muscle Tone: within normal limits Gait & Station: normal Patient leans: N/A            Psychiatric Specialty Exam:  Presentation  General Appearance: Appropriate  for Environment  Eye Contact:Fair  Speech:Clear and Coherent  Speech Volume:Normal  Handedness:No data recorded  Mood and Affect  Mood:Anxious  Affect:Congruent   Thought Process  Thought Processes:Coherent  Duration of Psychotic Symptoms: No data recorded Past Diagnosis of Schizophrenia or Psychoactive disorder: No data recorded Descriptions of Associations:Intact  Orientation:Full (Time, Place and Person)  Thought Content:Logical  Hallucinations:No data recorded Ideas of Reference:None  Suicidal Thoughts:No data recorded Homicidal Thoughts:No data recorded  Sensorium  Memory:Immediate [place]  Insight:Fair   Executive Functions  Concentration:Good  Attention Span:Good  [place]  Language:Good   Psychomotor Activity  Psychomotor Activity:No data recorded  Assets  Assets:Communication Skills; Desire for Improvement; Physical Health; Resilience; Social Support   Sleep  Sleep:No data recorded   Physical Exam: Physical Exam Vitals and nursing note reviewed.  Constitutional:      Appearance: Normal appearance.  HENT:     Head: Normocephalic and atraumatic.     Mouth/Throat:     Pharynx: Oropharynx is clear.  Eyes:     Pupils: Pupils are equal, round, and reactive to light.  Cardiovascular:     Rate and Rhythm: Normal rate and regular rhythm.  Pulmonary:     Effort: Pulmonary effort is normal.     Breath sounds: Normal breath sounds.  Abdominal:     General: Abdomen is flat.     Palpations: Abdomen is soft.  Musculoskeletal:        General: Normal range of motion.  Skin:    General: Skin is warm and dry.  Neurological:     General: No focal deficit present.     Mental Status: He is alert. Mental status is at baseline.  Psychiatric:        Attention and Perception: Attention normal.        Mood and Affect: Mood normal.        Speech: Speech normal.        Behavior: Behavior is cooperative.         Thought Content: Thought content normal.        Cognition and Memory: Cognition normal.    Review of Systems  Constitutional:  Negative.   HENT: Negative.    Eyes: Negative.   Respiratory: Negative.    Cardiovascular: Negative.   Gastrointestinal: Negative.   Musculoskeletal: Negative.   Skin: Negative.   Neurological: Negative.   Psychiatric/Behavioral:  Positive for depression and substance abuse. Negative for hallucinations and suicidal ideas. The patient is nervous/anxious.    Blood pressure (!) 140/118, pulse 75, temperature 97.9 F (36.6 C), temperature source Oral, resp. rate 18, height 6' (1.829 m), weight 72.3 kg, SpO2 100 %. Body mass index is 21.63 kg/m.  Treatment Plan Summary: Medication management and Plan sertraline was restarted and I have suggested increasing that to 50 mg.  Continue 15-minute checks.  Not currently having any symptoms of alcohol withdrawal that required direct treatment.  He has engaged with treatment team and in individual therapy and is fully agreeable to outpatient treatment in the community after discharge.  Ongoing assessment of dangerous before discharge but probably will be able to let him go on Thursday  Observation Level/Precautions:  15 minute checks  Laboratory:  Chemistry Profile  Psychotherapy:    Medications:    Consultations:    Discharge Concerns:    Estimated LOS:  Other:     Physician Treatment Plan for Primary Diagnosis: Alcohol abuse Long Term Goal(s): Improvement in symptoms so as ready for discharge  Short Term Goals: Ability to disclose and discuss suicidal ideas and Ability to demonstrate self-control will improve  Physician Treatment Plan for Secondary Diagnosis: Principal Problem:   Alcohol abuse Active Problems:   Depression, major, severe recurrence ([place])   Substance induced mood disorder ([place])  Long Term Goal(s): Improvement in symptoms so as ready for discharge  Short Term Goals: Ability to identify  triggers associated with substance abuse/mental health issues will improve  I certify that inpatient services furnished can reasonably be expected to improve the patient's condition.    Alethia Berthold, MD 8/23/20234:02 PM

## 2021-10-03 NOTE — BHH Suicide Risk Assessment (Signed)
[facility] Admission Suicide Risk Assessment   Nursing information obtained from:  Patient Demographic factors:  Male, Caucasian, Low socioeconomic status Current Mental Status:  NA Loss Factors:  Decline in physical health Historical Factors:  Prior suicide attempts Risk Reduction Factors:  Responsible for children under 36 years of age, Sense of responsibility to family, Religious beliefs about death, Employed, Living with another person, especially a relative, Positive social support, Positive therapeutic relationship, Positive coping skills or problem solving skills  Total Time spent with patient: 45 minutes Principal Problem: Alcohol abuse Diagnosis:  Principal Problem:   Alcohol abuse Active Problems:   Depression, major, severe recurrence ([facility])   Substance induced mood disorder ([facility])  Subjective Data: 36 year old man presented to the emergency room in [place] under IVC after an episode of heavy drinking for 2 days accompanied by a suicidal threat and hostile behavior.  Patient is now sober and admits to his heavy drinking and a suicidal statement but denies any current suicidal ideation.  States positive plans for the future including a desire to engage in treatment  Continued Clinical Symptoms:  Alcohol Use Disorder Identification Test Final Score (AUDIT): 27 The "Alcohol Use Disorders Identification Test", Guidelines for Use in Primary Care, Second Edition.  World Science writer [facility]). Score between 0-7:  no or low risk or alcohol related problems. Score between 8-15:  moderate risk of alcohol related problems. Score between 16-19:  high risk of alcohol related problems. Score 20 or above:  warrants further diagnostic evaluation for alcohol dependence and treatment.   CLINICAL FACTORS:   Depression:   Comorbid alcohol abuse/dependence Alcohol/Substance Abuse/Dependencies   Musculoskeletal: Strength & Muscle Tone: within normal limits Gait & Station: normal Patient  leans: N/A  Psychiatric Specialty Exam:  Presentation  General Appearance: Appropriate for Environment  Eye Contact:Fair  Speech:Clear and Coherent  Speech Volume:Normal  Handedness:No data recorded  Mood and Affect  Mood:Anxious  Affect:Congruent   Thought Process  Thought Processes:Coherent  Descriptions of Associations:Intact  Orientation:Full (Time, Place and Person)  Thought Content:Logical  History of Schizophrenia/Schizoaffective disorder:No data recorded Duration of Psychotic Symptoms:No data recorded Hallucinations:No data recorded Ideas of Reference:None  Suicidal Thoughts:No data recorded Homicidal Thoughts:No data recorded  Sensorium  Memory:Immediate Fair  Judgment:Fair  Insight:Fair   Executive Functions  Concentration:Good  Attention Span:Good  Recall:Good  Fund of Knowledge:Good  Language:Good   Psychomotor Activity  Psychomotor Activity:No data recorded  Assets  Assets:Communication Skills; Desire for Improvement; Physical Health; Resilience; Social Support   Sleep  Sleep:No data recorded   Physical Exam: Physical Exam Vitals and nursing note reviewed.  Constitutional:      Appearance: Normal appearance.  HENT:     Head: Normocephalic and atraumatic.     Mouth/Throat:     Pharynx: Oropharynx is clear.  Eyes:     Pupils: Pupils are equal, round, and reactive to light.  Cardiovascular:     Rate and Rhythm: Normal rate and regular rhythm.  Pulmonary:     Effort: Pulmonary effort is normal.     Breath sounds: Normal breath sounds.  Abdominal:     General: Abdomen is flat.     Palpations: Abdomen is soft.  Musculoskeletal:        General: Normal range of motion.  Skin:    General: Skin is warm and dry.  Neurological:     General: No focal deficit present.     Mental Status: He is alert. Mental status is at baseline.  Psychiatric:  Attention and Perception: Attention normal.        Mood and Affect: Mood  normal.        Speech: Speech normal.        Behavior: Behavior normal.        Thought Content: Thought content normal.        Cognition and Memory: Cognition normal.        Judgment: Judgment normal.    Review of Systems  Constitutional: Negative.   HENT: Negative.    Eyes: Negative.   Respiratory: Negative.    Cardiovascular: Negative.   Gastrointestinal: Negative.   Musculoskeletal: Negative.   Skin: Negative.   Neurological: Negative.   Psychiatric/Behavioral:  Positive for depression and substance abuse. Negative for hallucinations and suicidal ideas. The patient is nervous/anxious.    Blood pressure (!) 140/118, pulse 75, temperature 97.9 F (36.6 C), temperature source Oral, resp. rate 18, height 6' (1.829 m), weight 72.3 kg, SpO2 100 %. Body mass index is 21.63 kg/m.   COGNITIVE FEATURES THAT CONTRIBUTE TO RISK:  None    SUICIDE RISK:   Minimal: No identifiable suicidal ideation.  Patients presenting with no risk factors but with morbid ruminations; may be classified as minimal risk based on the severity of the depressive symptoms  PLAN OF CARE: Continue monitoring on 15-minute checks.  Restarting sertraline for depression and anxiety.  Psychoeducation in group and individual therapy and inclusion in substance abuse treatment.  Reassess dangerousness prior to discharge  I certify that inpatient services furnished can reasonably be expected to improve the patient's condition.   Mordecai Rasmussen, MD 10/03/2021, 3:55 PM

## 2021-10-03 NOTE — Group Note (Signed)
[facility] LCSW Group Therapy Note   Group Date: 10/03/2021 Start Time: 1300 End Time: 1400   Type of Therapy/Topic:  Group Therapy:  Emotion Regulation  Participation Level:  Active    Description of Group:    The purpose of this group is to assist patients in learning to regulate negative emotions and experience positive emotions. Patients will be guided to discuss ways in which they have been vulnerable to their negative emotions. These vulnerabilities will be juxtaposed with experiences of positive emotions or situations, and patients challenged to use positive emotions to combat negative ones. Special emphasis will be placed on coping with negative emotions in conflict situations, and patients will process healthy conflict resolution skills.  Therapeutic Goals: Patient will identify two positive emotions or experiences to reflect on in order to balance out negative emotions:  Patient will label two or more emotions that they find the most difficult to experience:  Patient will be able to demonstrate positive conflict resolution skills through discussion or role plays:   Summary of Patient Progress: Patient was present for the entirety of the meeting. He was actively engaged in the conversation and his behavior was appropriate. Pt identified emotional regulation as the practice of knowing how to manage your emotions. He identified anger and sadness as strong emotions he was feeling prior to admission. Pt stated that he tends to get loud, become tearful, or experience other physical symptoms when he is angry or sad. He shared that oftentimes distractions helps him manage his emotions but also acknowledges that he self-medicates with alcohol to deal with emotions also. Pt expresses that he knows this is bad but sometimes he does not feel that he can do anything differently. Pt was open and receptive to feedback/comments from both peers and facilitator.    Therapeutic Modalities:   Cognitive  Behavioral Therapy Feelings Identification Dialectical Behavioral Therapy   Glenis Smoker, LCSW

## 2021-10-03 NOTE — Progress Notes (Signed)
Patient better. More active on the unit, hanging out in the dayroom and socializing with the other patients.  Reports being able to go home tomorrow and looking forward to being able to "get back on track." He denies si/hi/avh depression and anxiety at this encounter. He has no QHS medication, but may come and get something later to help aid in sleep.        C Butler-Nicholson, LPN

## 2021-10-03 NOTE — [facility] Group Notes (Signed)
[facility] Group Notes:  (Nursing/MHT/Case Management/Adjunct)  Date:  10/03/2021  Time:  10:02 PM  Type of Therapy:   Wrap up  Participation Level:  Active  Participation Quality:  Appropriate  Affect:  Appropriate  Cognitive:  Alert  Insight:  Good  Engagement in Group:  Engaged and pt. has strange hard look in eye sometimes  and said he want to be D/C and to work on issues.  Modes of Intervention:  Support  Summary of Progress/Problems:  Mayra Neer 10/03/2021, 10:02 PM

## 2021-10-03 NOTE — B[facility] Group Notes (Signed)
[facility] Group Notes:  (Nursing/MHT/Case Management/Adjunct)  Date:  10/03/2021  Time:  12:28 AM  Type of Therapy:  Group Therapy  Participation Level:  Active  Participation Quality:  Appropriate, Attentive, and Supportive  Affect:  Appropriate  Cognitive:    Insight:  appropriate    Engagement in Group:  Engaged and Supportive  Modes of Intervention:  Discussion and Support  Summary of Progress/Problems:  Maglione,Laquesha Holcomb E 10/03/2021, 12:28 AM

## 2021-10-03 NOTE — BH IP Treatment Plan (Signed)
Interdisciplinary Treatment and Diagnostic Plan Update  10/03/2021 Time of Session: 09:37 Timothy Moses MRN: [id number]  Principal Diagnosis: Alcohol abuse  Secondary Diagnoses: Principal Problem:   Alcohol abuse   Current Medications:  Current Facility-Administered Medications  Medication Dose Route Frequency Provider Last Rate Last Admin   acetaminophen (TYLENOL) tablet 650 mg  650 mg Oral Q6H PRN Clapacs, Jackquline Denmark, MD       alum & mag hydroxide-simeth (MAALOX/MYLANTA) 200-200-20 MG/5ML suspension 30 mL  30 mL Oral Q4H PRN Clapacs, Jackquline Denmark, MD       hydrOXYzine (ATARAX) tablet 25 mg  25 mg Oral TID PRN Clapacs, Jackquline Denmark, MD       magnesium hydroxide (MILK OF MAGNESIA) suspension 30 mL  30 mL Oral Daily PRN Clapacs, Jackquline Denmark, MD       nicotine (NICODERM CQ - dosed in mg/24 hours) patch 21 mg  21 mg Transdermal Daily Dorothea Ogle B, RPH   21 mg at 10/03/21 0835   [START ON 10/04/2021] sertraline (ZOLOFT) tablet 50 mg  50 mg Oral Daily Clapacs, Jackquline Denmark, MD       traZODone (DESYREL) tablet 50 mg  50 mg Oral QHS PRN Clapacs, Jackquline Denmark, MD   50 mg at 10/02/21 2124   PTA Medications: Medications Prior to Admission  Medication Sig Dispense Refill Last Dose   acetaminophen (TYLENOL) 500 MG tablet Take 1,000 mg by mouth 2 (two) times daily as needed for mild pain or headache.      OVER THE COUNTER MEDICATION Take 1 capsule by mouth daily. Alpha Brain       Patient Stressors: Marital or family conflict   Substance abuse    Patient Strengths: Ability for insight  Active sense of humor  Average or above average intelligence  Capable of independent living  Arboriculturist fund of knowledge  Physical Health  Special hobby/interest  Supportive family/friends  Work skills   Treatment Modalities: Medication Management, Group therapy, Case management,  1 to 1 session with clinician, Psychoeducation, Recreational therapy.   Physician Treatment Plan for Primary  Diagnosis: Alcohol abuse Long Term Goal(s):     Short Term Goals:    Medication Management: Evaluate patient's response, side effects, and tolerance of medication regimen.  Therapeutic Interventions: 1 to 1 sessions, Unit Group sessions and Medication administration.  Evaluation of Outcomes: Progressing  Physician Treatment Plan for Secondary Diagnosis: Principal Problem:   Alcohol abuse  Long Term Goal(s):     Short Term Goals:       Medication Management: Evaluate patient's response, side effects, and tolerance of medication regimen.  Therapeutic Interventions: 1 to 1 sessions, Unit Group sessions and Medication administration.  Evaluation of Outcomes: Progressing   RN Treatment Plan for Primary Diagnosis: Alcohol abuse Long Term Goal(s): Knowledge of disease and therapeutic regimen to maintain health will improve  Short Term Goals: Ability to remain free from injury will improve, Ability to verbalize frustration and anger appropriately will improve, Ability to demonstrate self-control, Ability to participate in decision making will improve, Ability to verbalize feelings will improve, Ability to disclose and discuss suicidal ideas, Ability to identify and develop effective coping behaviors will improve, and Compliance with prescribed medications will improve  Medication Management: RN will administer medications as ordered by provider, will assess and evaluate patient's response and provide education to patient for prescribed medication. RN will report any adverse and/or side effects to prescribing provider.  Therapeutic Interventions: 1 on 1 counseling sessions,  Psychoeducation, Medication administration, Evaluate responses to treatment, Monitor vital signs and CBGs as ordered, Perform/monitor CIWA, COWS, AIMS and Fall Risk screenings as ordered, Perform wound care treatments as ordered.  Evaluation of Outcomes: Progressing   LCSW Treatment Plan for Primary Diagnosis: Alcohol  abuse Long Term Goal(s): Safe transition to appropriate next level of care at discharge, Engage patient in therapeutic group addressing interpersonal concerns.  Short Term Goals: Engage patient in aftercare planning with referrals and resources, Increase social support, Increase ability to appropriately verbalize feelings, Increase emotional regulation, Facilitate acceptance of mental health diagnosis and concerns, Facilitate patient progression through stages of change regarding substance use diagnoses and concerns, Identify triggers associated with mental health/substance abuse issues, and Increase skills for wellness and recovery  Therapeutic Interventions: Assess for all discharge needs, 1 to 1 time with Social worker, Explore available resources and support systems, Assess for adequacy in community support network, Educate family and significant other(s) on suicide prevention, Complete Psychosocial Assessment, Interpersonal group therapy.  Evaluation of Outcomes: Progressing   Progress in Treatment: Attending groups: Yes. Participating in groups: Yes. Taking medication as prescribed: Yes. Toleration medication: Yes. Family/Significant other contact made: No, will contact:  wife, Saliou Barnier. Patient understands diagnosis: Yes. Discussing patient identified problems/goals with staff: Yes. Medical problems stabilized or resolved: Yes. Denies suicidal/homicidal ideation: Yes. Issues/concerns per patient self-inventory: No. Other: none.  New problem(s) identified: No, Describe:  none identified  New Short Term/Long Term Goal(s): detox, medication management for mood stabilization; elimination of SI thoughts; development of comprehensive mental wellness/sobriety plan.  Patient Goals:  "Probably anger management. I think I need therapy, I think that's what I need."  Discharge Plan or Barriers: CSW will assist pt with development of an appropriate aftercare/discharge plan.   Reason for  Continuation of Hospitalization: Anxiety Medication stabilization Suicidal ideation Withdrawal symptoms  Estimated Length of Stay: 1-7 days  Last 3 [place] Suicide Severity Risk Score: Flowsheet Row Admission (Current) from 10/02/[facility]21 in [facility] INPATIENT [facility]AL MEDICINE ED from 09/30/2021 in [facility]PARTMENT  C-SSRS RISK CATEGORY High Risk No Risk       Last PHQ 2/9 Scores:    09/19/2017    3:05 PM 09/19/2017    2:11 PM  Depression screen PHQ 2/9  Decreased Interest 0 0  Down, Depressed, Hopeless 0 0  PHQ - 2 Score 0 0    Scribe for Treatment Team: Glenis Smoker, LCSW 10/03/2021 3:02 PM

## 2021-10-03 NOTE — Plan of Care (Signed)
  Problem: Coping: Goal: Coping ability will improve Outcome: Progressing   Problem: Self-Concept: Goal: Ability to disclose and discuss suicidal ideas will improve Outcome: Not Progressing Goal: Will verbalize positive feelings about self Outcome: Not Progressing   Problem: Safety: Goal: Ability to remain free from injury will improve Outcome: Progressing

## 2021-10-03 NOTE — Progress Notes (Signed)
Patient has been isolative to his room since the beginning of the shift. He is pleasant and cooperative and received PRN medication to help aid in sleep.  He denies si/hi/avh depression anxiety and pain at this encounter  Reports having a misunderstanding with his wife and "saying some stupid stuff that landed me in here."  He is safe on the unit with 15 minute safety checks.  Encouraged patient to seek nurse with any concerns that he may.     C Butler-Nicholson, LPN

## 2021-10-03 NOTE — Progress Notes (Signed)
Recreation Therapy Notes   Date: 10/03/2021  Time: 10:20 am    Location: Courtyard    Behavioral response: N/A   Intervention Topic: Wellness   Discussion/Intervention: Patient refused to attend group.   Clinical Observations/Feedback:  Patient refused to attend group.    Wylder Macomber LRT/CTRS        Wylie Russon 10/03/2021 12:25 PM

## 2021-10-04 DIAGNOSIS — F1010 Alcohol abuse, uncomplicated: Secondary | ICD-10-CM | POA: Diagnosis not present

## 2021-10-04 NOTE — Progress Notes (Signed)
D: Pt alert and oriented. Pt denies experiencing any pain, SI/HI, or AVH at this time. Pt reports he will be able to keep himself safe when he returns home.   A: Pt received discharge and medication education/information. Pt belongings were returned and signed for at this time to include medication supply ordered by MD, as well as discharge paperwork.   R: Pt verbalized understanding of discharge and medication education/information.  Pt escorted by staff to the medical mall front lobby where pt's wife picked him up.
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
## 2021-10-04 NOTE — BHH Suicide Risk Assessment (Signed)
[facility] Discharge Suicide Risk Assessment   Principal Problem: Alcohol abuse Discharge Diagnoses: Principal Problem:   Alcohol abuse Active Problems:   Depression, major, severe recurrence (HCC)   Substance induced mood disorder (HCC)   Total Time spent with patient: 30 minutes  Musculoskeletal: Strength & Muscle Tone: within normal limits Gait & Station: normal Patient leans: N/A  Psychiatric Specialty Exam  Presentation  General Appearance: Appropriate for Environment  Eye Contact:Fair  Speech:Clear and Coherent  Speech Volume:Normal  Handedness:No data recorded  Mood and Affect  Mood:Anxious  Duration of Depression Symptoms: No data recorded Affect:Congruent   Thought Process  Thought Processes:Coherent  Descriptions of Associations:Intact  Orientation:Full (Time, Place and Person)  Thought Content:Logical  History of Schizophrenia/Schizoaffective disorder:No data recorded Duration of Psychotic Symptoms:No data recorded Hallucinations:No data recorded Ideas of Reference:None  Suicidal Thoughts:No data recorded Homicidal Thoughts:No data recorded  Sensorium  Memory:Immediate Fair  Judgment:Fair  Insight:Fair   Executive Functions  Concentration:Good  Attention Span:Good  Recall:Good  Fund of Knowledge:Good  Language:Good   Psychomotor Activity  Psychomotor Activity:No data recorded  Assets  Assets:Communication Skills; Desire for Improvement; Physical Health; Resilience; Social Support   Sleep  Sleep:No data recorded  Physical Exam: Physical Exam Vitals and nursing note reviewed.  Constitutional:      Appearance: Normal appearance.  HENT:     Head: Normocephalic and atraumatic.     Mouth/Throat:     Pharynx: Oropharynx is clear.  Eyes:     Pupils: Pupils are equal, round, and reactive to light.  Cardiovascular:     Rate and Rhythm: Normal rate and regular rhythm.  Pulmonary:     Effort: Pulmonary effort is normal.      Breath sounds: Normal breath sounds.  Abdominal:     General: Abdomen is flat.     Palpations: Abdomen is soft.  Musculoskeletal:        General: Normal range of motion.  Skin:    General: Skin is warm and dry.  Neurological:     General: No focal deficit present.     Mental Status: He is alert. Mental status is at baseline.  Psychiatric:        Attention and Perception: Attention normal.        Mood and Affect: Mood normal.        Speech: Speech normal.        Behavior: Behavior normal.        Thought Content: Thought content normal.        Cognition and Memory: Cognition normal.        Judgment: Judgment normal.    Review of Systems  Constitutional: Negative.   HENT: Negative.    Eyes: Negative.   Respiratory: Negative.    Cardiovascular: Negative.   Gastrointestinal: Negative.   Musculoskeletal: Negative.   Skin: Negative.   Neurological: Negative.   Psychiatric/Behavioral: Negative.     Blood pressure (!) 136/91, pulse 74, temperature 97.9 F (36.6 C), temperature source Oral, resp. rate 18, height 6' (1.829 m), weight 72.3 kg, SpO2 100 %. Body mass index is 21.63 kg/m.  Mental Status Per Nursing Assessment::   On Admission:  NA  Demographic Factors:  Male  Loss Factors: NA  Historical Factors: Prior suicide attempts  Risk Reduction Factors:   Responsible for children under 36 years of age, Sense of responsibility to family, Positive social support, and Positive therapeutic relationship  Continued Clinical Symptoms:  Depression:   Comorbid alcohol abuse/dependence Alcohol/Substance Abuse/Dependencies  Cognitive Features That Contribute  To Risk:  None    Suicide Risk:  Minimal: No identifiable suicidal ideation.  Patients presenting with no risk factors but with morbid ruminations; may be classified as minimal risk based on the severity of the depressive symptoms    Plan Of Care/Follow-up recommendations:  Other:  Patient denies suicidal ideation  and has a appropriate upbeat positive affect.  He denies any wish to harm himself or others and is agreeable to an appropriate outpatient discharge plan.  No longer meets commitment criteria.  Timothy Rasmussen, MD 10/04/2021, 10:44 AM

## 2021-10-04 NOTE — Plan of Care (Signed)
D: Pt alert and oriented. Pt rates depression 0/10, hopelessness 0/10, and anxiety 1/10. Pt goal: "To be aware of my feelings mentally and to act in a health manner in all facets of my life." Pt reports energy level as normal and concentration as being good. Pt reports sleep last night as being fair. Pt denies experiencing any pain at this time. Pt denies experiencing any SI/HI, or AVH at this time.   A: Scheduled medications administered to pt, per MD orders. Support and encouragement provided. Frequent verbal contact made. Routine safety checks conducted q15 minutes.   R: No adverse drug reactions noted. Pt verbally contracts for safety at this time. Pt compliant with medications and treatment plan. Pt interacts well with others on the unit. Pt remains safe at this time. Plan of care ongoing.   Problem: Coping: Goal: Coping ability will improve Outcome: Progressing   Problem: Medication: Goal: Compliance with prescribed medication regimen will improve Outcome: Progressing

## 2021-10-04 NOTE — Progress Notes (Signed)
  [facility] Adult Case Management Discharge Plan :  Will you be returning to the same living situation after discharge:  Yes,  pt plans to return home. At discharge, do you have transportation home?: Yes,  wife to provide transportation. Do you have the ability to pay for your medications: No.  Release of information consent forms completed and in the chart;  Patient's signature needed at discharge.  Patient to Follow up at:  Follow-up Information     [facility].   Specialty: Behavioral Health Why: They have walk-in hours Monday through Friday, 7am-11am. Follow up with them per stated plans on Monday. Thanks! Contact information: [address] [place] [address] [phone]                 Next level of care provider has access to [facility] Link:no  Safety Planning and Suicide Prevention discussed: Yes,  SPE completed with pt.     Has patient been referred to the Quitline?: Patient refused referral  Patient has been referred for addiction treatment: Yes  Glenis Smoker, LCSW 10/04/2021, 11:19 AM

## 2021-10-04 NOTE — [facility] Suicide Risk Assessment (Signed)
[facility] INPATIENT:  Family/Significant Other Suicide Prevention Education  Suicide Prevention Education:  Education Completed; Jadiel Schmieder [phone]), has been identified by the patient as the family member/significant other with whom the patient will be residing, and identified as the person(s) who will aid the patient in the event of a mental health crisis (suicidal ideations/suicide attempt).  With written consent from the patient, the family member/significant other has been provided the following suicide prevention education, prior to the and/or following the discharge of the patient.  The suicide prevention education provided includes the following: Suicide risk factors Suicide prevention and interventions National Suicide Hotline telephone number [facility] assessment telephone number [facility] Emergency Assistance 911 [facility] and/or Residential Mobile Crisis Unit telephone number  Request made of family/significant other to: Remove weapons (e.g., guns, rifles, knives), all items previously/currently identified as safety concern.   Remove drugs/medications (over-the-counter, prescriptions, illicit drugs), all items previously/currently identified as a safety concern.  The family member/significant other verbalizes understanding of the suicide prevention education information provided.  The family member/significant other agrees to remove the items of safety concern listed above.  Kirker expressed concerns as she had not been contacted by staff and pt was being discharged. She stated that this pt was just trying to kill himself and that she has concerns about his safety with him returning home. CSW attempted to explain the typical process to [place] and apologized for not reaching out to her the day prior. She continued to make statements about her concerns around just taking the pt's word for everything. CSW stated that the provider would be updated regarding her  concerns. She agreed. CSW explained discharge plans to Dwight. She agreed, stating that she would have just liked to be informed about what was going on and hopes that this does not happen with other pts. No other concerns expressed. Contact ended without incident.   Glenis Smoker 10/04/2021, 4:24 PM

## 2021-10-04 NOTE — [facility] Group Notes (Signed)
[facility] Group Notes:  (Nursing/MHT/Case Management/Adjunct)  Date:  10/04/2021  Time:  10:40 AM  Type of Therapy:   community meeting  Participation Level:  Did Not Attend  Participation Quality:   Rodena Goldmann 10/04/2021, 10:40 AM

## 2021-10-04 NOTE — Progress Notes (Signed)
Recreation Therapy Notes   Date: 10/04/2021   Time: 10:35 am     Location: Craft room  Behavioral response: Appropriate  Intervention Topic:  Stress Management   Discussion/Intervention:  Group content on today was focused on stress. The group defined stress and way to cope with stress. Participants expressed how they know when they are stresses out. Individuals described the different ways they have to cope with stress. The group stated reasons why it is important to cope with stress. Patient explained what good stress is and some examples. The group participated in the intervention "Stress Management". Individuals were separated into two group and answered questions related to stress.  Clinical Observations/Feedback: Patient came to group late and expressed that drug use [place] a negative stress management tool that can affect health in s negative way. He explained that working on stress management skills can improve relationships. Individual was social with peers and staff while participating in the intervention.  Elouise Divelbiss LRT/CTRS        Areil Ottey 10/04/2021 12:00 PM

## 2021-10-04 NOTE — [facility] Suicide Risk Assessment (Signed)
[facility] INPATIENT:  Family/Significant Other Suicide Prevention Education  Suicide Prevention Education:  Patient Refusal for Family/Significant Other Suicide Prevention Education: The patient Timothy Moses has refused to provide written consent for family/significant other to be provided Family/Significant Other Suicide Prevention Education during admission and/or prior to discharge.  Physician notified.  SPE completed with pt, as pt refused to consent to family contact. SPI pamphlet provided to pt and pt was encouraged to share information with support network, ask questions, and talk about any concerns relating to SPE. Pt denies access to guns/firearms and verbalized understanding of information provided. Mobile Crisis information also provided to pt.  Glenis Smoker 10/04/2021, 11:18 AM

## 2021-10-04 NOTE — Discharge Summary (Signed)
Physician Discharge Summary Note  Patient:  Timothy Moses is an 36 y.o., male MRN:  [id number] DOB:  May 08, 1985 Patient phone:  [phone] (home)  Patient address:   [address] [place] [address],  Total Time spent with patient: 45 minutes  Date of Admission:  10/02/2021 Date of Discharge: 10/04/2021  Reason for Admission: Patient was admitted because of an episode in which he texted or made suicidal statements after a 2-day binge of drinking  Principal Problem: Alcohol abuse Discharge Diagnoses: Principal Problem:   Alcohol abuse Active Problems:   Depression, major, severe recurrence (HCC)   Substance induced mood disorder (HCC)   Past Psychiatric History: History of substance abuse including alcohol and other drugs in the past as well as chronic depression and anxiety.  Past history of suicide attempt years ago.  Past Medical History:  Past Medical History:  Diagnosis Date   ADHD (attention deficit hyperactivity disorder)    Anxiety    Depression    History reviewed. No pertinent surgical history. Family History:  Family History  Problem Relation Age of Onset   COPD Mother    Cancer Mother        Ovarian   Anuerysm Father        Brain x2   Family Psychiatric  History: See previous Social History:  Social History   Substance and Sexual Activity  Alcohol Use Yes     Social History   Substance and Sexual Activity  Drug Use No    Social History   Socioeconomic History   Marital status: Significant Other    Spouse name: Not on file   Number of children: Not on file   Years of education: Not on file   Highest education level: Not on file  Occupational History   Not on file  Tobacco Use   Smoking status: Former    Packs/day: 0.50    Types: Cigarettes, E-cigarettes    Quit date: 09/24/2016    Years since quitting: 5.0   Smokeless tobacco: Former  Building services engineer Use: Some days   Substances: Nicotine  Substance and Sexual Activity    Alcohol use: Yes   Drug use: No   Sexual activity: Not on file  Other Topics Concern   Not on file  Social History Narrative   ** Merged History Encounter **       Social Determinants of Health   Financial Resource Strain: Not on file  Food Insecurity: Not on file  Transportation Needs: Not on file  Physical Activity: Not on file  Stress: Not on file  Social Connections: Not on file    Hospital Course: Admitted to psychiatric unit.  No significant behavior problems.  Cooperated with intake and treatment planning.  Restarted Zoloft which was tolerated fine.  Did not need alcohol withdrawal detox.  Participated with treatment team.  Patient is agreeable to recommendations for outpatient substance abuse and mental health follow-up.  No longer meets commitment criteria.  Patient will be provided with medication supply and can be discharged back home with follow-up in his home county  Physical Findings: AIMS:  , ,  ,  ,    CIWA:    COWS:     Musculoskeletal: Strength & Muscle Tone: within normal limits Gait & Station: normal Patient leans: N/A   Psychiatric Specialty Exam:  Presentation  General Appearance: Appropriate for Environment  Eye Contact:Fair  Speech:Clear and Coherent  Speech Volume:Normal  Handedness:No data recorded  Mood and Affect  Mood:Anxious  Affect:Congruent   Thought Process  Thought Processes:Coherent  Descriptions of Associations:Intact  Orientation:Full (Time, Place and Person)  Thought Content:Logical  History of Schizophrenia/Schizoaffective disorder:No data recorded Duration of Psychotic Symptoms:No data recorded Hallucinations:No data recorded Ideas of Reference:None  Suicidal Thoughts:No data recorded Homicidal Thoughts:No data recorded  Sensorium  Memory:Immediate Fair  Judgment:Fair  Insight:Fair   Executive Functions  Concentration:Good  Attention Span:Good  Recall:Good  Fund of  Knowledge:Good  Language:Good   Psychomotor Activity  Psychomotor Activity:No data recorded  Assets  Assets:Communication Skills; Desire for Improvement; Physical Health; Resilience; Social Support   Sleep  Sleep:No data recorded   Physical Exam: Physical Exam Vitals and nursing note reviewed.  Constitutional:      Appearance: Normal appearance.  HENT:     Head: Normocephalic and atraumatic.     Mouth/Throat:     Pharynx: Oropharynx is clear.  Eyes:     Pupils: Pupils are equal, round, and reactive to light.  Cardiovascular:     Rate and Rhythm: Normal rate and regular rhythm.  Pulmonary:     Effort: Pulmonary effort is normal.     Breath sounds: Normal breath sounds.  Abdominal:     General: Abdomen is flat.     Palpations: Abdomen is soft.  Musculoskeletal:        General: Normal range of motion.  Skin:    General: Skin is warm and dry.  Neurological:     General: No focal deficit present.     Mental Status: He is alert. Mental status is at baseline.  Psychiatric:        Attention and Perception: Attention normal.        Mood and Affect: Mood normal.        Speech: Speech normal.        Behavior: Behavior normal.        Thought Content: Thought content normal.        Cognition and Memory: Cognition normal.        Judgment: Judgment normal.    Review of Systems  Constitutional: Negative.   HENT: Negative.    Eyes: Negative.   Respiratory: Negative.    Cardiovascular: Negative.   Gastrointestinal: Negative.   Musculoskeletal: Negative.   Skin: Negative.   Neurological: Negative.   Psychiatric/Behavioral: Negative.     Blood pressure (!) 136/91, pulse 74, temperature 97.9 F (36.6 C), temperature source Oral, resp. rate 18, height 6' (1.829 m), weight 72.3 kg, SpO2 100 %. Body mass index is 21.63 kg/m.   Social History   Tobacco Use  Smoking Status Former   Packs/day: 0.50   Types: Cigarettes, E-cigarettes   Quit date: 09/24/2016   Years since  quitting: 5.0  Smokeless Tobacco Former   Tobacco Cessation:  A prescription for an FDA-approved tobacco cessation medication provided at discharge   Blood Alcohol level:  Lab Results  Component Value Date   ETH 182 (H) 09/30/2021   ETH 222 (H) 05/17/2014    Metabolic Disorder Labs:  No results found for: "HGBA1C", "MPG" No results found for: "PROLACTIN" No results found for: "CHOL", "TRIG", "HDL", "CHOLHDL", "VLDL", "LDLCALC"  See Psychiatric Specialty Exam and Suicide Risk Assessment completed by Attending Physician prior to discharge.  Discharge destination:  Home  Is patient on multiple antipsychotic therapies at discharge:  No   Has Patient had three or more failed trials of antipsychotic monotherapy by history:  No  Recommended Plan for Multiple Antipsychotic Therapies: NA  Discharge Instructions  Diet - low sodium heart healthy   Complete by: As directed    Increase activity slowly   Complete by: As directed       Allergies as of 10/04/2021   No Known Allergies      Medication List     STOP taking these medications    acetaminophen 500 MG tablet Commonly known as: TYLENOL   OVER THE COUNTER MEDICATION       TAKE these medications      Indication  hydrOXYzine 25 MG tablet Commonly known as: ATARAX Take 1 tablet (25 mg total) by mouth 3 (three) times daily as needed for anxiety.  Indication: Feeling Anxious   nicotine 21 mg/24hr patch Commonly known as: NICODERM CQ - dosed in mg/24 hours Place 1 patch (21 mg total) onto the skin daily.  Indication: Nicotine Addiction   sertraline 50 MG tablet Commonly known as: ZOLOFT Take 1 tablet (50 mg total) by mouth daily.  Indication: Major Depressive Disorder   traZODone 50 MG tablet Commonly known as: DESYREL Take 1 tablet (50 mg total) by mouth at bedtime as needed for sleep.  Indication: Trouble Sleeping         Follow-up recommendations: Recommend follow-up with outpatient mental  health treatment in [facility] as well as substance abuse treatment  Comments: Prescriptions and supply provided  Signed: Mordecai Rasmussen, MD 10/04/2021, 10:46 AM

## 2021-11-01 ENCOUNTER — Other Ambulatory Visit: Payer: Self-pay

## 2021-11-01 ENCOUNTER — Emergency Department (HOSPITAL_BASED_OUTPATIENT_CLINIC_OR_DEPARTMENT_OTHER)
Admission: EM | Admit: 2021-11-01 | Discharge: 2021-11-02 | Disposition: A | Discharge status: Home / Self Care | Payer: Self-pay | Attending: Emergency Medicine | Admitting: Emergency Medicine

## 2021-11-01 ENCOUNTER — Encounter (HOSPITAL_BASED_OUTPATIENT_CLINIC_OR_DEPARTMENT_OTHER): Payer: Self-pay

## 2021-11-01 DIAGNOSIS — W260XXA Contact with knife, initial encounter: Secondary | ICD-10-CM | POA: Insufficient documentation

## 2021-11-01 DIAGNOSIS — I10 Essential (primary) hypertension: Secondary | ICD-10-CM | POA: Insufficient documentation

## 2021-11-01 DIAGNOSIS — S61511A Laceration without foreign body of right wrist, initial encounter: Secondary | ICD-10-CM

## 2021-11-01 MED ORDER — LIDOCAINE HCL (PF) 1 % IJ SOLN
5.0000 mL | Freq: Once | INTRAMUSCULAR | Status: AC
  Administered 2021-11-02: 5 mL
  Filled 2021-11-01: qty 5

## 2021-11-01 NOTE — ED Triage Notes (Signed)
Pt presents to the ED with laceration noted to lateral side of right forearm. States that it was cut with a razor blade at work. No bleeding at this time. Pt reports last tetanus shot was right at 5 years ago.

## 2021-11-02 NOTE — ED Provider Notes (Addendum)
[facility] EMERGENCY DEPT Provider Note   CSN: [id number] Arrival date & time: 11/01/21  2044     History  Chief Complaint  Patient presents with   Laceration    Timothy Moses is a 36 y.o. male.  Patient is a 36 year old male with history of ADHD, hypertension.  Patient presenting today with complaints of a right wrist laceration.  He was using a utility knife to cut wire at work when he inadvertently cut himself.  Bleeding controlled with direct pressure.  Last tetanus was 4 years ago.  The history is provided by the patient.       Home Medications Prior to Admission medications   Medication Sig Start Date End Date Taking? Authorizing Provider  hydrOXYzine (ATARAX) 25 MG tablet Take 1 tablet (25 mg total) by mouth 3 (three) times daily as needed for anxiety. 10/03/21   Clapacs, Jackquline Denmark, MD  nicotine (NICODERM CQ - DOSED IN MG/24 HOURS) 21 mg/24hr patch Place 1 patch (21 mg total) onto the skin daily. 10/04/21   Clapacs, Jackquline Denmark, MD  sertraline (ZOLOFT) 50 MG tablet Take 1 tablet (50 mg total) by mouth daily. 10/04/21   Clapacs, Jackquline Denmark, MD  traZODone (DESYREL) 50 MG tablet Take 1 tablet (50 mg total) by mouth at bedtime as needed for sleep. 10/03/21   Clapacs, Jackquline Denmark, MD      Allergies    Patient has no known allergies.    Review of Systems   Review of Systems  All other systems reviewed and are negative.   Physical Exam Updated Vital Signs BP (!) 144/83   Pulse 66   Temp 98 F (36.7 C) (Oral)   Resp 18   Ht 6\' 1"  (1.854 m)   Wt 81.6 kg   SpO2 98%   BMI 23.75 kg/m  Physical Exam Vitals and nursing note reviewed.  Constitutional:      Appearance: Normal appearance.  HENT:     Head: Normocephalic.  Pulmonary:     Effort: Pulmonary effort is normal.  Musculoskeletal:     Comments: There is a 2.5 cm laceration noted to the ulnar aspect of the right wrist.  There is no arterial or ligamentous involvement.  He is able to extend his wrist and little  finger without difficulty.  Skin:    General: Skin is warm and dry.  Neurological:     Mental Status: He is alert and oriented to person, place, and time.     ED Results / Procedures / Treatments   Labs (all labs ordered are listed, but only abnormal results are displayed) Labs Reviewed - No data to display  EKG None  Radiology No results found.  Procedures Procedures    Medications Ordered in ED Medications  lidocaine (PF) (XYLOCAINE) 1 % injection 5 mL (5 mLs Infiltration Given 11/02/21 0010)    ED Course/ Medical Decision Making/ A&P  Patient presenting with a right wrist laceration after cutting himself with a utility knife.  Last tetanus was 4 years ago.  Laceration repaired as below.  Patient to be discharged with local wound care and suture removal in 1 week.  LACERATION REPAIR Performed by: 11/04/21 Authorized by: Geoffery Lyons Consent: Verbal consent obtained. Risks and benefits: risks, benefits and alternatives were discussed Consent given by: patient Patient identity confirmed: provided demographic data Prepped and Draped in normal sterile fashion Wound explored  Laceration Location: Right wrist  Laceration Length: 2.5 cm  No Foreign Bodies seen or palpated  Anesthesia: local infiltration  Local anesthetic: lidocaine 1% without epinephrine  Anesthetic total: 2 ml  Irrigation method: syringe Amount of cleaning: standard  Skin closure: 4-0 Prolene  Number of sutures: 3  Technique: Simple interrupted  Patient tolerance: Patient tolerated the procedure well with no immediate complications.   Final Clinical Impression(s) / ED Diagnoses Final diagnoses:  None    Rx / DC Orders ED Discharge Orders     None         Veryl Speak, MD 11/02/21 7416    Veryl Speak, MD 11/02/21 3845

## 2021-11-02 NOTE — Discharge Instructions (Signed)
Local wound care with bacitracin and dressing changes twice daily.  Keep the wound clean, dry, and covered.  Sutures are to be removed in 1 week.  Please follow-up with your primary doctor for this.  Return to the emergency department if you develop redness surrounding the wound, pus draining from the wound, streaks extending up the arm, or for other new and concerning symptoms.

## 2021-12-14 ENCOUNTER — Other Ambulatory Visit: Payer: Self-pay

## 2021-12-14 ENCOUNTER — Encounter (HOSPITAL_BASED_OUTPATIENT_CLINIC_OR_DEPARTMENT_OTHER): Payer: Self-pay

## 2021-12-14 ENCOUNTER — Emergency Department (HOSPITAL_BASED_OUTPATIENT_CLINIC_OR_DEPARTMENT_OTHER)
Admission: EM | Admit: 2021-12-14 | Discharge: 2021-12-14 | Disposition: A | Discharge status: Home / Self Care | Payer: Self-pay | Attending: Emergency Medicine | Admitting: Emergency Medicine

## 2021-12-14 DIAGNOSIS — Z79899 Other long term (current) drug therapy: Secondary | ICD-10-CM | POA: Insufficient documentation

## 2021-12-14 DIAGNOSIS — K047 Periapical abscess without sinus: Secondary | ICD-10-CM | POA: Insufficient documentation

## 2021-12-14 MED ORDER — AMOXICILLIN 500 MG PO CAPS: 500.0000 mg | ORAL_CAPSULE | Freq: Three times a day (TID) | ORAL | 0 refills | Status: AC

## 2021-12-14 MED ORDER — BUPIVACAINE HCL (PF) 0.5 % IJ SOLN
10.0000 mL | Freq: Once | INTRAMUSCULAR | Status: AC
  Administered 2021-12-14: 10 mL
  Filled 2021-12-14: qty 10

## 2021-12-14 NOTE — ED Provider Notes (Signed)
[facility] EMERGENCY DEPT Provider Note   CSN: [id number] Arrival date & time: 12/14/21  1427     History  Chief Complaint  Patient presents with   Dental Pain    Timothy Moses is a 36 y.o. male.  Patient is a 36 year old male who presents with tooth pain.  He complains of pain in his left upper back tooth for the last 2 to 3 days.  No drainage, no facial swelling.  He did have some vomiting related to the pain today.  He said his temperature was about 99 earlier.  He has a dentist that is gone to see him next week but she is not in town until Wednesday.       Home Medications Prior to Admission medications   Medication Sig Start Date End Date Taking? Authorizing Provider  amoxicillin (AMOXIL) 500 MG capsule Take 1 capsule (500 mg total) by mouth 3 (three) times daily. 12/14/21  Yes Rolan Bucco, MD  hydrOXYzine (ATARAX) 25 MG tablet Take 1 tablet (25 mg total) by mouth 3 (three) times daily as needed for anxiety. 10/03/21   Clapacs, Jackquline Denmark, MD  nicotine (NICODERM CQ - DOSED IN MG/24 HOURS) 21 mg/24hr patch Place 1 patch (21 mg total) onto the skin daily. 10/04/21   Clapacs, Jackquline Denmark, MD  sertraline (ZOLOFT) 50 MG tablet Take 1 tablet (50 mg total) by mouth daily. 10/04/21   Clapacs, Jackquline Denmark, MD  traZODone (DESYREL) 50 MG tablet Take 1 tablet (50 mg total) by mouth at bedtime as needed for sleep. 10/03/21   Clapacs, Jackquline Denmark, MD      Allergies    Patient has no known allergies.    Review of Systems   Review of Systems  Constitutional:  Negative for fever.  HENT:  Positive for dental problem. Negative for facial swelling.   Gastrointestinal:  Positive for nausea and vomiting.  Skin:  Negative for wound.  Neurological:  Positive for headaches.    Physical Exam Updated Vital Signs BP (!) 155/89 (BP Location: Right Arm)   Pulse (!) 53   Temp (!) 97.5 F (36.4 C) (Oral)   Resp 18   Ht 6\' 1"  (1.854 m)   Wt 81.6 kg   SpO2 100%   BMI 23.73 kg/m  Physical  Exam HENT:     Mouth/Throat:     Comments: Positive tenderness to the left upper back molar.  The tooth appears decayed and is partially covered by gum issue.  No induration or fluctuance.  No trismus.  Uvula is midline. Cardiovascular:     Rate and Rhythm: Normal rate.  Pulmonary:     Effort: Pulmonary effort is normal.  Abdominal:     Tenderness: There is no abdominal tenderness.  Skin:    General: Skin is warm and dry.  Neurological:     Mental Status: He is alert and oriented to person, place, and time.     ED Results / Procedures / Treatments   Labs (all labs ordered are listed, but only abnormal results are displayed) Labs Reviewed - No data to display  EKG None  Radiology No results found.  Procedures Dental Block  Date/Time: 12/14/2021 6:03 PM  Performed by: 13/04/2021, MD Authorized by: Rolan Bucco, MD   Consent:    Consent obtained:  Verbal   Consent given by:  Patient   Risks, benefits, and alternatives were discussed: yes   Indications:    Indications: dental pain   Location:  Block type:  Anterior superior alveolar   Laterality:  Left Procedure details:    Syringe type:  Controlled syringe   Needle gauge:  25 G   Anesthetic injected:  Bupivacaine 0.25% w/o epi   Injection procedure:  Anatomic landmarks identified, introduced needle, incremental injection, negative aspiration for blood and anatomic landmarks palpated Post-procedure details:    Outcome:  Pain improved   Procedure completion:  Tolerated well, no immediate complications     Medications Ordered in ED Medications  bupivacaine(PF) (MARCAINE) 0.5 % injection 10 mL (has no administration in time range)    ED Course/ Medical Decision Making/ A&P                           Medical Decision Making Problems Addressed: Dental infection: acute illness or injury  Risk Prescription drug management.   Patient is a 36 year old who presents with dental pain.  I do not see any  clinical evidence of abscess.  He does have a lot of decay.  We will start him on amoxicillin.  Dental block was performed with good anesthesia.  He is going to be able to see a dentist next week.  Return precautions were given.  Final Clinical Impression(s) / ED Diagnoses Final diagnoses:  Dental infection    Rx / DC Orders ED Discharge Orders          Ordered    amoxicillin (AMOXIL) 500 MG capsule  3 times daily        12/14/21 1802              Malvin Johns, MD 12/14/21 1805

## 2021-12-14 NOTE — Discharge Instructions (Addendum)
Follow-up with the dentist as soon as possible.  Return to the emergency room if you have any worsening symptoms.

## 2021-12-14 NOTE — ED Triage Notes (Signed)
Patient here POV from Home.  Endorses Toothache for approximately 2-3 Days. Mainly Upper Teeth.   NAD noted during Triage. A&Ox4. GCS 15. Ambulatory.

## 2021-12-31 ENCOUNTER — Other Ambulatory Visit: Payer: Self-pay | Admitting: Psychiatry

## 2022-06-17 ENCOUNTER — Encounter (HOSPITAL_COMMUNITY): Payer: Self-pay

## 2022-08-28 ENCOUNTER — Encounter (HOSPITAL_BASED_OUTPATIENT_CLINIC_OR_DEPARTMENT_OTHER): Payer: Self-pay | Admitting: Emergency Medicine

## 2022-08-28 ENCOUNTER — Other Ambulatory Visit: Payer: Self-pay

## 2022-08-28 ENCOUNTER — Emergency Department (HOSPITAL_BASED_OUTPATIENT_CLINIC_OR_DEPARTMENT_OTHER)
Admission: EM | Admit: 2022-08-28 | Discharge: 2022-08-28 | Disposition: A | Discharge status: Home / Self Care | Payer: Worker's Compensation | Attending: Emergency Medicine | Admitting: Emergency Medicine

## 2022-08-28 DIAGNOSIS — T754XXA Electrocution, initial encounter: Secondary | ICD-10-CM | POA: Diagnosis not present

## 2022-08-28 DIAGNOSIS — R079 Chest pain, unspecified: Secondary | ICD-10-CM | POA: Diagnosis not present

## 2022-08-28 LAB — COMPREHENSIVE METABOLIC PANEL
ALT: 12 U/L (ref 0–44)
AST: 20 U/L (ref 15–41)
Albumin: 4 g/dL (ref 3.5–5.0)
Alkaline Phosphatase: 53 U/L (ref 38–126)
Anion gap: 9 (ref 5–15)
BUN: 8 mg/dL (ref 6–20)
CO2: 27 mmol/L (ref 22–32)
Calcium: 8.8 mg/dL — ABNORMAL LOW (ref 8.9–10.3)
Chloride: 102 mmol/L (ref 98–111)
Creatinine, Ser: 0.74 mg/dL (ref 0.61–1.24)
GFR, Estimated: >60 mL/min (ref >60)
Glucose, Bld: 101 mg/dL — ABNORMAL HIGH (ref 70–99)
Potassium: 3.7 mmol/L (ref 3.5–5.1)
Sodium: 138 mmol/L (ref 135–145)
Total Bilirubin: 0.7 mg/dL (ref 0.3–1.2)
Total Protein: 7.1 g/dL (ref 6.5–8.1)

## 2022-08-28 LAB — TROPONIN I (HIGH SENSITIVITY)
Troponin I (High Sensitivity): <2 ng/L (ref <18)
Troponin I (High Sensitivity): <2 ng/L (ref <18)

## 2022-08-28 LAB — URINALYSIS, MICROSCOPIC (REFLEX)

## 2022-08-28 LAB — URINALYSIS, ROUTINE W REFLEX MICROSCOPIC
Glucose, UA: NEGATIVE mg/dL
Hgb urine dipstick: NEGATIVE
Ketones, ur: NEGATIVE mg/dL
Leukocytes,Ua: NEGATIVE
Nitrite: NEGATIVE
Protein, ur: 30 mg/dL — AB
Specific Gravity, Urine: >=1.03 (ref 1.005–1.030)
pH: 6 (ref 5.0–8.0)

## 2022-08-28 LAB — CBC
HCT: 36.8 % — ABNORMAL LOW (ref 39.0–52.0)
Hemoglobin: 12.8 g/dL — ABNORMAL LOW (ref 13.0–17.0)
MCH: 29.8 pg (ref 26.0–34.0)
MCHC: 34.8 g/dL (ref 30.0–36.0)
MCV: 85.6 fL (ref 80.0–100.0)
Platelets: 283 10*3/uL (ref 150–400)
RBC: 4.3 MIL/uL (ref 4.22–5.81)
RDW: 12.2 % (ref 11.5–15.5)
WBC: 4.5 10*3/uL (ref 4.0–10.5)
nRBC: 0 % (ref 0.0–0.2)

## 2022-08-28 LAB — LACTIC ACID, PLASMA: Lactic Acid, Venous: 0.9 mmol/L (ref 0.5–1.9)

## 2022-08-28 LAB — CK: Total CK: 94 U/L (ref 49–397)

## 2022-08-28 MED ORDER — ACETAMINOPHEN 500 MG PO TABS
1000.0000 mg | ORAL_TABLET | Freq: Once | ORAL | Status: AC
  Administered 2022-08-28: 1000 mg via ORAL
  Filled 2022-08-28: qty 2
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
